# Patient Record
Sex: Male | Born: 1940 | Race: White | Hispanic: No | Smoking: Former smoker
Health system: Southern US, Community
[De-identification: ages and names within clinical notes are randomized; demographics above are authoritative.]

## PROBLEM LIST (undated history)

## (undated) DIAGNOSIS — N189 Chronic kidney disease, unspecified: Secondary | ICD-10-CM

## (undated) DIAGNOSIS — D649 Anemia, unspecified: Secondary | ICD-10-CM

## (undated) DIAGNOSIS — M1990 Unspecified osteoarthritis, unspecified site: Secondary | ICD-10-CM

## (undated) DIAGNOSIS — I10 Essential (primary) hypertension: Secondary | ICD-10-CM

## (undated) DIAGNOSIS — I219 Acute myocardial infarction, unspecified: Secondary | ICD-10-CM

## (undated) DIAGNOSIS — I509 Heart failure, unspecified: Secondary | ICD-10-CM

## (undated) DIAGNOSIS — R0602 Shortness of breath: Secondary | ICD-10-CM

## (undated) DIAGNOSIS — Z950 Presence of cardiac pacemaker: Secondary | ICD-10-CM

## (undated) DIAGNOSIS — E119 Type 2 diabetes mellitus without complications: Secondary | ICD-10-CM

## (undated) DIAGNOSIS — I4891 Unspecified atrial fibrillation: Secondary | ICD-10-CM

## (undated) DIAGNOSIS — J449 Chronic obstructive pulmonary disease, unspecified: Secondary | ICD-10-CM

## (undated) DIAGNOSIS — Z95810 Presence of automatic (implantable) cardiac defibrillator: Secondary | ICD-10-CM

## (undated) DIAGNOSIS — E039 Hypothyroidism, unspecified: Secondary | ICD-10-CM

## (undated) DIAGNOSIS — K219 Gastro-esophageal reflux disease without esophagitis: Secondary | ICD-10-CM

## (undated) DIAGNOSIS — I2510 Atherosclerotic heart disease of native coronary artery without angina pectoris: Secondary | ICD-10-CM

## (undated) DIAGNOSIS — J189 Pneumonia, unspecified organism: Secondary | ICD-10-CM

## (undated) HISTORY — PX: CORONARY ANGIOPLASTY: SHX604

## (undated) HISTORY — PX: APPENDECTOMY: SHX54

## (undated) HISTORY — PX: INSERT / REPLACE / REMOVE PACEMAKER: SUR710

---

## 2014-01-17 ENCOUNTER — Institutional Professional Consult (permissible substitution): Payer: Self-pay | Admitting: Internal Medicine

## 2014-01-29 ENCOUNTER — Inpatient Hospital Stay (HOSPITAL_COMMUNITY)
Admission: AD | Admit: 2014-01-29 | Discharge: 2014-02-11 | DRG: 264 | Disposition: A | Discharge status: Home / Self Care | Payer: Medicare PPO | Source: Other Acute Inpatient Hospital | Attending: Cardiology | Admitting: Cardiology

## 2014-01-29 ENCOUNTER — Encounter (HOSPITAL_COMMUNITY): Payer: Self-pay | Admitting: General Practice

## 2014-01-29 DIAGNOSIS — Z7901 Long term (current) use of anticoagulants: Secondary | ICD-10-CM

## 2014-01-29 DIAGNOSIS — I252 Old myocardial infarction: Secondary | ICD-10-CM

## 2014-01-29 DIAGNOSIS — Z91199 Patient's noncompliance with other medical treatment and regimen due to unspecified reason: Secondary | ICD-10-CM

## 2014-01-29 DIAGNOSIS — K219 Gastro-esophageal reflux disease without esophagitis: Secondary | ICD-10-CM | POA: Diagnosis present

## 2014-01-29 DIAGNOSIS — Z95810 Presence of automatic (implantable) cardiac defibrillator: Secondary | ICD-10-CM | POA: Diagnosis present

## 2014-01-29 DIAGNOSIS — IMO0001 Reserved for inherently not codable concepts without codable children: Secondary | ICD-10-CM | POA: Diagnosis present

## 2014-01-29 DIAGNOSIS — I428 Other cardiomyopathies: Secondary | ICD-10-CM | POA: Diagnosis present

## 2014-01-29 DIAGNOSIS — Z87891 Personal history of nicotine dependence: Secondary | ICD-10-CM

## 2014-01-29 DIAGNOSIS — I2510 Atherosclerotic heart disease of native coronary artery without angina pectoris: Secondary | ICD-10-CM | POA: Diagnosis present

## 2014-01-29 DIAGNOSIS — R0602 Shortness of breath: Secondary | ICD-10-CM

## 2014-01-29 DIAGNOSIS — E871 Hypo-osmolality and hyponatremia: Secondary | ICD-10-CM | POA: Diagnosis present

## 2014-01-29 DIAGNOSIS — I25810 Atherosclerosis of coronary artery bypass graft(s) without angina pectoris: Secondary | ICD-10-CM

## 2014-01-29 DIAGNOSIS — Z9861 Coronary angioplasty status: Secondary | ICD-10-CM

## 2014-01-29 DIAGNOSIS — N179 Acute kidney failure, unspecified: Secondary | ICD-10-CM

## 2014-01-29 DIAGNOSIS — N058 Unspecified nephritic syndrome with other morphologic changes: Secondary | ICD-10-CM | POA: Diagnosis present

## 2014-01-29 DIAGNOSIS — I5023 Acute on chronic systolic (congestive) heart failure: Secondary | ICD-10-CM

## 2014-01-29 DIAGNOSIS — E1129 Type 2 diabetes mellitus with other diabetic kidney complication: Secondary | ICD-10-CM | POA: Diagnosis present

## 2014-01-29 DIAGNOSIS — N189 Chronic kidney disease, unspecified: Secondary | ICD-10-CM

## 2014-01-29 DIAGNOSIS — E875 Hyperkalemia: Secondary | ICD-10-CM | POA: Diagnosis present

## 2014-01-29 DIAGNOSIS — I509 Heart failure, unspecified: Secondary | ICD-10-CM

## 2014-01-29 DIAGNOSIS — N186 End stage renal disease: Secondary | ICD-10-CM | POA: Diagnosis present

## 2014-01-29 DIAGNOSIS — I5043 Acute on chronic combined systolic (congestive) and diastolic (congestive) heart failure: Principal | ICD-10-CM | POA: Diagnosis present

## 2014-01-29 DIAGNOSIS — E039 Hypothyroidism, unspecified: Secondary | ICD-10-CM | POA: Diagnosis present

## 2014-01-29 DIAGNOSIS — Z8249 Family history of ischemic heart disease and other diseases of the circulatory system: Secondary | ICD-10-CM

## 2014-01-29 DIAGNOSIS — D638 Anemia in other chronic diseases classified elsewhere: Secondary | ICD-10-CM | POA: Diagnosis present

## 2014-01-29 DIAGNOSIS — I10 Essential (primary) hypertension: Secondary | ICD-10-CM | POA: Diagnosis present

## 2014-01-29 DIAGNOSIS — J9601 Acute respiratory failure with hypoxia: Secondary | ICD-10-CM | POA: Diagnosis present

## 2014-01-29 DIAGNOSIS — I4891 Unspecified atrial fibrillation: Secondary | ICD-10-CM

## 2014-01-29 DIAGNOSIS — J9600 Acute respiratory failure, unspecified whether with hypoxia or hypercapnia: Secondary | ICD-10-CM | POA: Diagnosis present

## 2014-01-29 DIAGNOSIS — Z9119 Patient's noncompliance with other medical treatment and regimen: Secondary | ICD-10-CM

## 2014-01-29 DIAGNOSIS — M129 Arthropathy, unspecified: Secondary | ICD-10-CM | POA: Diagnosis present

## 2014-01-29 HISTORY — DX: Chronic kidney disease, unspecified: N18.9

## 2014-01-29 HISTORY — DX: Essential (primary) hypertension: I10

## 2014-01-29 HISTORY — DX: Unspecified atrial fibrillation: I48.91

## 2014-01-29 HISTORY — DX: Anemia, unspecified: D64.9

## 2014-01-29 HISTORY — DX: Atherosclerotic heart disease of native coronary artery without angina pectoris: I25.10

## 2014-01-29 HISTORY — DX: Gastro-esophageal reflux disease without esophagitis: K21.9

## 2014-01-29 HISTORY — DX: Shortness of breath: R06.02

## 2014-01-29 HISTORY — DX: Pneumonia, unspecified organism: J18.9

## 2014-01-29 HISTORY — DX: Presence of automatic (implantable) cardiac defibrillator: Z95.810

## 2014-01-29 HISTORY — DX: Heart failure, unspecified: I50.9

## 2014-01-29 HISTORY — DX: Type 2 diabetes mellitus without complications: E11.9

## 2014-01-29 HISTORY — DX: Hypothyroidism, unspecified: E03.9

## 2014-01-29 HISTORY — DX: Unspecified osteoarthritis, unspecified site: M19.90

## 2014-01-29 HISTORY — DX: Acute myocardial infarction, unspecified: I21.9

## 2014-01-29 HISTORY — DX: Chronic obstructive pulmonary disease, unspecified: J44.9

## 2014-01-29 HISTORY — DX: Presence of cardiac pacemaker: Z95.0

## 2014-01-29 LAB — CBC
HCT: 31.1 % — ABNORMAL LOW (ref 39.0–52.0)
Hemoglobin: 10.3 g/dL — ABNORMAL LOW (ref 13.0–17.0)
MCH: 29.5 pg (ref 26.0–34.0)
MCHC: 33.1 g/dL (ref 30.0–36.0)
MCV: 89.1 fL (ref 78.0–100.0)
PLATELETS: 212 10*3/uL (ref 150–400)
RBC: 3.49 MIL/uL — ABNORMAL LOW (ref 4.22–5.81)
RDW: 15.1 % (ref 11.5–15.5)
WBC: 6.9 10*3/uL (ref 4.0–10.5)

## 2014-01-29 LAB — COMPREHENSIVE METABOLIC PANEL
ALT: 21 U/L (ref 0–53)
AST: 24 U/L (ref 0–37)
Albumin: 3.3 g/dL — ABNORMAL LOW (ref 3.5–5.2)
Alkaline Phosphatase: 63 U/L (ref 39–117)
BUN: 97 mg/dL — AB (ref 6–23)
CO2: 23 mEq/L (ref 19–32)
CREATININE: 3.53 mg/dL — AB (ref 0.50–1.35)
Calcium: 8.4 mg/dL (ref 8.4–10.5)
Chloride: 89 mEq/L — ABNORMAL LOW (ref 96–112)
GFR calc Af Amer: 18 mL/min — ABNORMAL LOW (ref >90)
GFR calc non Af Amer: 16 mL/min — ABNORMAL LOW (ref >90)
Glucose, Bld: 207 mg/dL — ABNORMAL HIGH (ref 70–99)
Potassium: 5.1 mEq/L (ref 3.7–5.3)
Sodium: 132 mEq/L — ABNORMAL LOW (ref 137–147)
TOTAL PROTEIN: 6.6 g/dL (ref 6.0–8.3)
Total Bilirubin: <0.2 mg/dL — ABNORMAL LOW (ref 0.3–1.2)

## 2014-01-29 LAB — TSH: TSH: 3.42 u[IU]/mL (ref 0.350–4.500)

## 2014-01-29 LAB — PHOSPHORUS: PHOSPHORUS: 5.5 mg/dL — AB (ref 2.3–4.6)

## 2014-01-29 LAB — MAGNESIUM: MAGNESIUM: 2 mg/dL (ref 1.5–2.5)

## 2014-01-29 LAB — DIGOXIN LEVEL: DIGOXIN LVL: 0.3 ng/mL — AB (ref 0.8–2.0)

## 2014-01-29 LAB — GLUCOSE, CAPILLARY
GLUCOSE-CAPILLARY: 210 mg/dL — AB (ref 70–99)
Glucose-Capillary: 272 mg/dL — ABNORMAL HIGH (ref 70–99)
Glucose-Capillary: 147 mg/dL — ABNORMAL HIGH (ref 70–99)

## 2014-01-29 LAB — TROPONIN I

## 2014-01-29 LAB — PRO B NATRIURETIC PEPTIDE: Pro B Natriuretic peptide (BNP): 24228 pg/mL — ABNORMAL HIGH (ref 0–125)

## 2014-01-29 MED ORDER — HYDRALAZINE HCL 25 MG PO TABS
12.5000 mg | ORAL_TABLET | Freq: Three times a day (TID) | ORAL | Status: DC
  Filled 2014-01-29 (×2): qty 0.5

## 2014-01-29 MED ORDER — ONDANSETRON HCL 4 MG/2ML IJ SOLN: 4.0000 mg | Freq: Four times a day (QID) | INTRAMUSCULAR | Status: DC | PRN

## 2014-01-29 MED ORDER — HYDRALAZINE HCL 25 MG PO TABS
12.5000 mg | ORAL_TABLET | Freq: Three times a day (TID) | ORAL | Status: DC
  Administered 2014-01-29 – 2014-02-01 (×8): 12.5 mg via ORAL
  Filled 2014-01-29 (×13): qty 0.5

## 2014-01-29 MED ORDER — ALBUTEROL SULFATE (2.5 MG/3ML) 0.083% IN NEBU
2.5000 mg | INHALATION_SOLUTION | RESPIRATORY_TRACT | Status: DC | PRN
  Administered 2014-02-03 – 2014-02-07 (×2): 2.5 mg via RESPIRATORY_TRACT
  Filled 2014-01-29 (×2): qty 3

## 2014-01-29 MED ORDER — HYDROCODONE-ACETAMINOPHEN 5-325 MG PO TABS
1.0000 | ORAL_TABLET | ORAL | Status: DC | PRN
  Administered 2014-02-03 – 2014-02-06 (×5): 1 via ORAL
  Filled 2014-01-29 (×5): qty 1

## 2014-01-29 MED ORDER — ONDANSETRON HCL 4 MG PO TABS: 4.0000 mg | ORAL_TABLET | Freq: Four times a day (QID) | ORAL | Status: DC | PRN

## 2014-01-29 MED ORDER — MILRINONE IN DEXTROSE 20 MG/100ML IV SOLN
0.2500 ug/kg/min | INTRAVENOUS | Status: DC
  Administered 2014-01-29 – 2014-01-31 (×5): 0.25 ug/kg/min via INTRAVENOUS
  Filled 2014-01-29 (×6): qty 100

## 2014-01-29 MED ORDER — ENOXAPARIN SODIUM 100 MG/ML ~~LOC~~ SOLN
100.0000 mg | Freq: Every day | SUBCUTANEOUS | Status: DC
  Administered 2014-01-30: 100 mg via SUBCUTANEOUS
  Filled 2014-01-29 (×3): qty 1

## 2014-01-29 MED ORDER — ENOXAPARIN SODIUM 40 MG/0.4ML ~~LOC~~ SOLN
40.0000 mg | SUBCUTANEOUS | Status: DC
  Administered 2014-01-29: 40 mg via SUBCUTANEOUS
  Filled 2014-01-29: qty 0.4

## 2014-01-29 MED ORDER — HYDROMORPHONE HCL PF 1 MG/ML IJ SOLN
0.5000 mg | INTRAMUSCULAR | Status: DC | PRN
  Administered 2014-01-31: 0.5 mg via INTRAVENOUS
  Filled 2014-01-29: qty 1

## 2014-01-29 MED ORDER — SODIUM CHLORIDE 0.9 % IJ SOLN
3.0000 mL | Freq: Two times a day (BID) | INTRAMUSCULAR | Status: DC
  Administered 2014-01-30: 3 mL via INTRAVENOUS

## 2014-01-29 MED ORDER — FUROSEMIDE 10 MG/ML IJ SOLN
20.0000 mg/h | INTRAVENOUS | Status: DC
  Administered 2014-01-29 – 2014-01-30 (×2): 15 mg/h via INTRAVENOUS
  Administered 2014-01-31: 20 mg/h via INTRAVENOUS
  Administered 2014-01-31: 15 mg/h via INTRAVENOUS
  Filled 2014-01-29 (×9): qty 25

## 2014-01-29 MED ORDER — ISOSORBIDE DINITRATE 10 MG PO TABS
10.0000 mg | ORAL_TABLET | Freq: Three times a day (TID) | ORAL | Status: DC
  Administered 2014-01-29 – 2014-01-30 (×4): 10 mg via ORAL
  Filled 2014-01-29 (×5): qty 1

## 2014-01-29 MED ORDER — SODIUM CHLORIDE 0.9 % IV SOLN: 250.0000 mL | INTRAVENOUS | Status: DC | PRN

## 2014-01-29 MED ORDER — ISOSORBIDE DINITRATE 10 MG PO TABS
10.0000 mg | ORAL_TABLET | Freq: Three times a day (TID) | ORAL | Status: DC
  Filled 2014-01-29 (×2): qty 1

## 2014-01-29 MED ORDER — SODIUM CHLORIDE 0.9 % IJ SOLN: 3.0000 mL | INTRAMUSCULAR | Status: DC | PRN

## 2014-01-29 NOTE — Consult Note (Signed)
Reason for Consult: CHF   Referring Physician:  Dr. Izola [name] ZOX:WRUEAV [name] [name] Primary Cardiologist: Dr. Dulce [name] EP: Dr. Philis [name] ?   [name] [name] is an 73 y.o. male.    Chief Complaint: SOB   HPI: 73 yo male with COPD, DM, HTN, HLD, CAD with BiV ICD, Anemia of chronic disease, Hypothyroidism, CHF (last EF 10% on last 2 D ECHO in January 2015), hospitalized in [place] from April 24,th 2015 - April 28th, 2015 and transferred to [facility] for further evaluation and management of CHF and failure to respond to medical therapy with Lasix IV. Pt explains he started to feel sick 2 weeks prior to admission to [facility] with main concern of progressively worsening shortness of breath, weight gain of 15 lbs (211 lbs at baseline --> 228 lbs on admission). He explains he has been non compliant with dietary restrictions- he was told his sodium was low so he ate more salt-, has reported 3-4 pillow orthopnea, dyspnea at rest and with exertion, no specific alleviating or aggravating factors. In addition, he reported productive cough of yellow sputum but this has now gotten better. Denies any chest pain.   While at [facility], he has been treated with Zithromax and Rocephin with BD's for COPD, he was also place on Lasix drip, metolazone by Dr. Dulce [name] (pt also started on Hydralazine, nitrates, low dose digoxin, fluid restriction) but has failed to respond to medical therapy and was therefore transferred to [facility].   He has seen Dr. Lowell [name] in the past for renal failure and plan was to place AV fistula soon.  Currently he does not have fistula.  Cardiac hx includes MI  > 15 yrs ago with low EF and PCI to LAD.  Since that time he has been followed for cardiomyopathy.  He has recurrent decompensation due to non compliance or anemia or COPD exacerbation.  He cannot use BB due to COPD and bronchospasm and no ACE or ARB due to chronic renal failure.  He has BiV ICD with most recent battery change 2 years ago.   He has atrial fib on eliquis.    Currently, SOB with talking.  HOB elevated for comfort. BMP today Na 130, K+ 6.1; cl 91; TCO2 23; glucose 152; BUN 95; Cr 3.50; mag 1.8;ca+ 8.4; a gap 22 GFR 17  Pro BNP 30,300; troponin neg;   H/H 10.5/30.8 WBC 7.0  plt 201 Heme + stool on the 27th   Past Medical History  Diagnosis Date  . Coronary artery disease   . Myocardial infarction   . Hypertension   . CHF (congestive heart failure)   . Shortness of breath   . Pacemaker   . Automatic implantable cardioverter-defibrillator in situ   . COPD (chronic obstructive pulmonary disease)   . Pneumonia     HX OF PNA  . Hypothyroidism   . 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 Chronic kidney disease   . GERD (gastroesophageal reflux disease)   . Arthritis   . Anemia     Past Surgical History  Procedure Laterality Date  . Appendectomy    . Insert / replace / remove pacemaker    . Coronary angioplasty      History reviewed. No pertinent family history. Social History:  reports that he quit smoking about 20 years ago. His smoking use included Cigarettes. He smoked 0.00 packs per day for 35 years. He has never used smokeless tobacco. He reports  that he does not drink alcohol or use illicit drugs. His wife was pt of Dr. Alanda [name].  She died in 74.  Allergies: No Known Allergies  OUTPATIENT MEDICATIONS: See med rec    No results found for this or any previous visit (from the past 48 hour(s)). No results found.  ROS: General:no colds or fevers, no weight changes Skin:no rashes or ulcers HEENT:no blurred vision, no congestion CV:see HPI PUL:see HPI GI:no diarrhea constipation or melena, no indigestion GU:no hematuria, no dysuria MS:no joint pain, no claudication Neuro:no syncope, no lightheadedness Endo:+ diabetes, no thyroid disease   Blood pressure 129/71, pulse 71, temperature 97.6 F (36.4 C), temperature source Oral, resp. rate 18, SpO2 99.00%. General: NAD Neck:  Thick, JVP 12 cm, no thyromegaly or thyroid nodule.  Lungs: Crackles at bases bilaterally CV: Nondisplaced PMI.  Heart regular S1/S2, no S3/S4, no murmur.  2+ edema to thighs bilaterally.  No carotid bruit.  Unable to palpate pedal pulses but has significant LE edema.  Abdomen: Soft, nontender, no hepatosplenomegaly, no distention.  Skin: Intact without lesions or rashes.  Neurologic: Alert and oriented x 3.  Psych: Normal affect. Extremities: No clubbing or cyanosis.  HEENT: Normal.   Assessment/Plan Principal Problem:   Acute respiratory failure with hypoxia Active Problems:   Shortness of breath   CHF (congestive heart failure), NYHA class IV   Acute on chronic renal failure   Hyperkalemia   Anemia of chronic disease   HTN (hypertension)   Unspecified hypothyroidism   Hyponatremia   CAD (coronary artery disease) of artery bypass graft   [name] [name]  Nurse Practitioner Certified [facility] Pager [phone] or after 5pm or weekends call [phone] 01/29/2014, 3:19 PM  Patient seen with NP, agree with the above note.  1. CHF: Acute on chronic CHF, EF 10% per report (done in [place]).  He is massively volume overloaded on exam. He has been at [facility] where they have had him on Lasix gtt @ 10 mg/hr.  He has not diuresed much at all on this regimen and weight has actually increased.  Creatinine was 3.5 today with K 6.1.  Suspect cardiorenal syndrome.   Unfortunately, he is nearing the need for hemodialysis for volume removal.  - I will give him a trial of milrinone to augment cardiac output to see is this will help diuresis.  Will start low dose 0.25 mcg/kg/min.  After milrinone has been running for about 30 minutes, restart Lasix gtt at 15 mg/hr.    - Stop digoxin.  - Will continue hydralazine at 12.5 mg every 8 hrs and isordil 10 mg every 8 hrs.  - No ACEI or spironolactone with CKD, hold off on beta blocker for now with concern for low output.  -  Will need to figure out what type of CRT-D device patient has so it can be interrogated.  2. Atrial fibrillation: Patient appears to be in underlying atrial fibrillation with BiV pacing.  He does not know what type of device he has.  Rate is 70.  I will get an ECG and interrogate his device to confirm underlying atrial fibrillation. Will need to watch for rise in rate with milrinone.  He has been on Eliquis.  Will hold for now for possible procedure (will need access if he ends up needing HD).  3. AKI on CKD: See above discussion.  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  Creatinine up to 3.5 with K 6.1.  Unclear if K  was treated today.  - Send BMET stat. - As above, plan trial of inotrope to see if augmentation of cardiac output can improve urine output.   If this fails, he will likely need hemodialysis for intractable volume overload.  4. CAD: Continue statin.  He is anticoagulated for atrial fibrillation.   No chest pain.   Laurey MoraleDalton S Navina Wohlers 01/29/2014 4:47 PM

## 2014-01-29 NOTE — Progress Notes (Signed)
Late Entry  Patient arrived to 69E01 from [facility] at approx 1130. Pt admitted with a lasix drip infusing continuously. Verified with [facility]) and unit pharmacist that it was appropriate to keep the patient on the unit. Later in the evening MD requested pt be started on a milrinone drip - pt requiring transfer to SDU at this time. Will monitor.  Cheril Slattery Arvella Nigh

## 2014-01-29 NOTE — Progress Notes (Addendum)
ANTICOAGULATION CONSULT NOTE - Initial Consult  Pharmacy Consult for Lovenox Indication: atrial fibrillation  No Known Allergies  Patient Measurements: Height: 5\' 9"  (175.3 cm) Weight: 226 lb 9.6 oz (102.785 kg) IBW/kg (Calculated) : 70.7 Heparin Dosing Weight:    Vital Signs: Temp: 98.1 F (36.7 C) (04/28 1949) Temp src: Oral (04/28 1949) BP: 124/66 mmHg (04/28 1949) Pulse Rate: 70 (04/28 1820)  Labs:  Recent Labs  01/29/14 1910  HGB 10.3*  HCT 31.1*  PLT 212  CREATININE 3.53*  TROPONINI <0.30    Estimated Creatinine Clearance: 21.7 ml/min (by C-G formula based on Cr of 3.53).   Medical History: Past Medical History  Diagnosis Date  . Coronary artery disease   . Myocardial infarction   . Hypertension   . CHF (congestive heart failure)   . Shortness of breath   . Pacemaker   . Automatic implantable cardioverter-defibrillator in situ     st jude BiV ICD  . COPD (chronic obstructive pulmonary disease)   . Pneumonia     HX OF PNA  . Hypothyroidism   . 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 Chronic kidney disease   . GERD (gastroesophageal reflux disease)   . Arthritis   . Anemia   . Atrial fibrillation     rate controlled    Medications:  No prescriptions prior to admission    Assessment: SOB 73 y/o M with complex PMH was transferred from [place] to [facility] for management of CHF after failing to respond to Lasix drip, metolazone, hydralazine, nitrates, digoxin. Last EF 1/15=10%. Patient has a h/o noncompliance and CKD (no ACE/ARB). Pt also in afib. Heme + stool noted (at [place]) on 4/27.  Labs:  Scr 3.53 with calculated CrCl 21 proBNP 24,228, Hgb 10.3  Angicoagulation: Pharmacy consulted to dose Lovenox for afib, however noted pt took Eliquis 5mg  BID PTA. I do not see it on his [facility] and pt doesn't know if he was receiving it or not there. Therefore, last dose is unknown whether is was prior to his [place] admission or this  AM.   Goal of Therapy:  Anti-Xa level 0.6-1 units/ml 4hrs after LMWH dose given Monitor platelets by anticoagulation protocol: Yes   Plan:  F/u need for HD Start Lovenox 100mg  SQ ONCE daily for renal function. CBC q72h while on Lovenox.   Keaun Schnabel S. Merilynn Finland, PharmD, [facility] Clinical Staff Pharmacist Pager [phone]  Pasty Spillers 01/29/2014,10:28 PM

## 2014-01-29 NOTE — Progress Notes (Signed)
Rapid response RN notified of pt's need to transfer to SDU. Pt is currently stable. Report called to [facility] by Morrie Sheldon, RN. Will monitor.  [name] [name] [name] [name]

## 2014-01-29 NOTE — Progress Notes (Signed)
Patient transferred to [facility].  Report called to RN.  CCMD notified of patient transfer.  Patient family members at bedside and escorted to new patient room.  Transferred with RN and NT.

## 2014-01-29 NOTE — H&P (Addendum)
[name] [name] History and Physical  [name] ReelBernie [name] ZOX:[id number] DOB: 12/11/1940 DOA: 01/29/2014  Referring [name]: [name] [name] PCP: [name] [name]   Chief Complaint: shortness of breath   HPI:  Pt is 73 yo male with COPD, DM, HTN, HLD, CAD with pacemaker, Anemia of chronic disease, Hypothyroidism, CHF (last EF 10% on last 2 D ECHO in January 2015), hospitalized in [place] from April 24,th 2015 - April 28th, 2015 and transferred to [name] [name] for further evaluation and management of CHF and failure to respond to medical therapy with Lasix. Pt explains he started to feel sick 2 weeks prior to admission to [name] with main concern of progressively worsening shortness of breath, weight gain of 15 lbs (211 lbs at baseline --> 228 lbs on admission). He explains he has been non compliant with dietary restrictions, has reported 3-4 pillow orthopnea, dyspnea at rest and with exertion, no specific alleviating or aggravating factors. In addition, he reported productive cough of yellow sputum but this has now gotten better.   [facility], he has been treated with Zithromax and Rocephin with BD's for COPD, he was also place on Lasix drip, metolazone by Dr. Dulce [name] (pt also started on Hydralazine, nitrates, low dose digoxin, fluid restriction) but has failed to respond to medical therapy and was therefore transferred to [name] [name].   [name] [name] with nephrology team has also been contacted and recommendation was to transfer to [name] [name] for further management.   Please note following vitals on discharge from [name] [name]: T 97.7 F, HR 70, RR 20, )2 sat 96&, Weight 226 lbs Blood work: Na 130, K 6.1, Cl 91, HCO3  23, BUN 95, Cr 3.50 WBC 7, Hg, 10.5, Plt 201  Assessment and Plan: Principal Problem:   Acute respiratory failure with hypoxia - secondary to severe systolic and diastolic CHF - currently on Lasix drip which will continue here and will also consult cardiology for further  recommendations - will also consult nephrology as pt is severely volume overloaded and is possibly going to require HD for better volume control  Active Problems:   CHF (congestive heart failure), NYHA class IV - management with Lasix drip for now, rest per cardiology team - continue hydralazine as well   Acute on chronic renal failure - will repeat BMP now due to hyperkalemia earlier in the day - lasix drip as noted above, nephrology consultation requested    Hyperkalemia - repeat BMP and monitor on telemetry    Anemia of chronic disease - no signs of active bleeding - CBC in AM   HTN (hypertension) - continue home medical regimen    Hyponatremia - secondary to volume status - repeat BMP in AM   CAD (coronary artery disease) of artery bypass graft - continue statin    Unspecified hypothyroidism - continue synthroid   Radiological Exams on Admission: No results found.  Code Status: Full Family Communication: Pt at bedside Disposition Plan: Admit for further evaluation    Review of Systems:  Constitutional: Negative for diaphoresis.  HENT: Negative for hearing loss, ear pain, nosebleeds, congestion, sore throat, neck pain, tinnitus and ear discharge.   Eyes: Negative for blurred vision, double vision, photophobia, pain, discharge and redness.  Respiratory: Per HPI.   Cardiovascular: per HPI Gastrointestinal: Negative for heartburn, constipation, blood in stool and melena.  Genitourinary: Negative for dysuria, urgency, frequency, hematuria and flank pain.  Musculoskeletal: Negative for myalgias, back pain, joint pain and falls.  Skin: Negative for itching and rash.  Neurological: Negative for  tingling, tremors, sensory change, speech change, focal weakness, loss of consciousness and headaches.  Endo/Heme/Allergies: Negative for environmental allergies and polydipsia. Does not bruise/bleed easily.  Psychiatric/Behavioral: Negative for suicidal ideas. The patient is not  nervous/anxious.      Past Medical History  Diagnosis Date  . Coronary artery disease   . Myocardial infarction   . Hypertension   . CHF (congestive heart failure)   . Shortness of breath   . Pacemaker   . Automatic implantable cardioverter-defibrillator in situ   . COPD (chronic obstructive pulmonary disease)   . Pneumonia     HX OF PNA  . Hypothyroidism   . 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 Chronic kidney disease   . GERD (gastroesophageal reflux disease)   . Arthritis   . Anemia     Past Surgical History  Procedure Laterality Date  . Appendectomy    . Insert / replace / remove pacemaker    . Coronary angioplasty      Social History:  reports that he quit smoking about 20 years ago. His smoking use included Cigarettes. He smoked 0.00 packs per day for 35 years. He has never used smokeless tobacco. He reports that he does not drink alcohol or use illicit drugs.  Not on File  Family history of HTN  Prior to Admission medications   Not on File    Physical Exam: Filed Vitals:   01/29/14 1149  BP: 129/71  Pulse: 71  Temp: 97.6 F (36.4 C)  TempSrc: Oral  Resp: 18  SpO2: 99%    Physical Exam  Constitutional: Appears in mild distress due to shortness of breath, diffuse anasarca  HENT: Normocephalic. External right and left ear normal. Oropharynx is clear and moist.  Eyes: Conjunctivae and EOM are normal. PERRLA, no scleral icterus.  Neck: Normal ROM. Neck supple. + JVD. No tracheal deviation. No thyromegaly.  CVS: RRR, S1/S2 +, no murmurs, no gallops, no carotid bruit.  Pulmonary: Rales bilaterally with dyspnea Abdominal: Soft. BS +,  no distension, tenderness, rebound or guarding.  Musculoskeletal: Normal range of motion. +4 bilateral LE pitting edema Lymphadenopathy: No lymphadenopathy noted, cervical, inguinal. Neuro: Alert. Normal reflexes, muscle tone coordination. No cranial nerve deficit. Skin: Skin is warm and dry. No rash noted.  Not diaphoretic. No erythema. No pallor.  Psychiatric: Normal mood and affect. Behavior, judgment, thought content normal.   Labs on Admission:  Basic Metabolic Panel: No results found for this basename: NA, K, CL, CO2, GLUCOSE, BUN, CREATININE, CALCIUM, MG, PHOS,  in the last 168 hours Liver Function Tests: No results found for this basename: AST, ALT, ALKPHOS, BILITOT, PROT, ALBUMIN,  in the last 168 hours No results found for this basename: LIPASE, AMYLASE,  in the last 168 hours No results found for this basename: AMMONIA,  in the last 168 hours CBC: No results found for this basename: WBC, NEUTROABS, HGB, HCT, MCV, PLT,  in the last 168 hours Cardiac Enzymes: No results found for this basename: CKTOTAL, CKMB, CKMBINDEX, TROPONINI,  in the last 168 hours BNP: No components found with this basename: POCBNP,  CBG: No results found for this basename: GLUCAP,  in the last 168 hours  EKG: Normal sinus rhythm, no ST/T wave changes  Dorothea Ogle, MD  [name] [name] Pager [phone]  If 7PM-7AM, please contact night-coverage [email or web address] Password [facility] 01/29/2014, 12:41 PM

## 2014-01-29 NOTE — Progress Notes (Signed)
St Jude BiV ICD interrogated -pt has been in atrial fib since 02/2013 at least.

## 2014-01-29 NOTE — Progress Notes (Addendum)
Admission note:  Verified patient ID with name and birthday.  Wristband placed on patient   Arrival Method: stretcher via [facility] Mental Orientation: A&O x4 Telemetry: placed on telemetry box #2, CCMD notified, Patient running v-paced  Assessment: See Doc Flowsheets Skin: blancheable redness noted on sacrum.  Noted in Doc Flowsheets IV: Right wrist IV access with Lasix drip running continuously Pain: denies  Tubes: n/a Safety Measures: Patient educated on fall safety.  Yellow non-slip socks on patient.  Bed alarm on.  Patient Educated on use of call bell and telephone and demonstrated understanding.  Family at bedside.   6700 Orientation: Patient has been oriented to the unit, staff and to the room.

## 2014-01-30 DIAGNOSIS — J9600 Acute respiratory failure, unspecified whether with hypoxia or hypercapnia: Secondary | ICD-10-CM

## 2014-01-30 DIAGNOSIS — I509 Heart failure, unspecified: Secondary | ICD-10-CM

## 2014-01-30 LAB — BASIC METABOLIC PANEL
BUN: 96 mg/dL — ABNORMAL HIGH (ref 6–23)
CALCIUM: 8.4 mg/dL (ref 8.4–10.5)
CO2: 24 mEq/L (ref 19–32)
CREATININE: 3.64 mg/dL — AB (ref 0.50–1.35)
Chloride: 95 mEq/L — ABNORMAL LOW (ref 96–112)
GFR calc Af Amer: 18 mL/min — ABNORMAL LOW (ref >90)
GFR, EST NON AFRICAN AMERICAN: 15 mL/min — AB (ref >90)
GLUCOSE: 109 mg/dL — AB (ref 70–99)
Potassium: 4.5 mEq/L (ref 3.7–5.3)
SODIUM: 138 meq/L (ref 137–147)

## 2014-01-30 LAB — GLUCOSE, CAPILLARY
Glucose-Capillary: 214 mg/dL — ABNORMAL HIGH (ref 70–99)
Glucose-Capillary: 185 mg/dL — ABNORMAL HIGH (ref 70–99)
Glucose-Capillary: 124 mg/dL — ABNORMAL HIGH (ref 70–99)

## 2014-01-30 LAB — URINALYSIS, ROUTINE W REFLEX MICROSCOPIC
Bilirubin Urine: NEGATIVE
Bilirubin Urine: NEGATIVE
GLUCOSE, UA: NEGATIVE mg/dL
GLUCOSE, UA: 250 mg/dL — AB
Hgb urine dipstick: NEGATIVE
KETONES UR: NEGATIVE mg/dL
KETONES UR: NEGATIVE mg/dL
LEUKOCYTES UA: NEGATIVE
Leukocytes, UA: NEGATIVE
NITRITE: NEGATIVE
Nitrite: NEGATIVE
PROTEIN: NEGATIVE mg/dL
Protein, ur: NEGATIVE mg/dL
SPECIFIC GRAVITY, URINE: 1.016 (ref 1.005–1.030)
Specific Gravity, Urine: 1.027 (ref 1.005–1.030)
Urobilinogen, UA: 0.2 mg/dL (ref 0.0–1.0)
Urobilinogen, UA: 0.2 mg/dL (ref 0.0–1.0)
pH: 5 (ref 5.0–8.0)
pH: 6 (ref 5.0–8.0)

## 2014-01-30 LAB — CBC
HCT: 29.2 % — ABNORMAL LOW (ref 39.0–52.0)
Hemoglobin: 9.6 g/dL — ABNORMAL LOW (ref 13.0–17.0)
MCH: 29.4 pg (ref 26.0–34.0)
MCHC: 32.9 g/dL (ref 30.0–36.0)
MCV: 89.3 fL (ref 78.0–100.0)
Platelets: 205 10*3/uL (ref 150–400)
RBC: 3.27 MIL/uL — AB (ref 4.22–5.81)
RDW: 15 % (ref 11.5–15.5)
WBC: 8.2 10*3/uL (ref 4.0–10.5)

## 2014-01-30 LAB — URINE MICROSCOPIC-ADD ON

## 2014-01-30 MED ORDER — ALBUTEROL SULFATE HFA 108 (90 BASE) MCG/ACT IN AERS
2.0000 | INHALATION_SPRAY | RESPIRATORY_TRACT | Status: DC | PRN
  Filled 2014-01-30: qty 6.7

## 2014-01-30 MED ORDER — GEMFIBROZIL 600 MG PO TABS
600.0000 mg | ORAL_TABLET | Freq: Two times a day (BID) | ORAL | Status: DC
  Administered 2014-01-30 – 2014-01-31 (×2): 600 mg via ORAL
  Filled 2014-01-30 (×4): qty 1

## 2014-01-30 MED ORDER — OXYBUTYNIN CHLORIDE ER 10 MG PO TB24
10.0000 mg | ORAL_TABLET | Freq: Every day | ORAL | Status: DC
  Administered 2014-01-30 – 2014-01-31 (×2): 10 mg via ORAL
  Filled 2014-01-30 (×3): qty 1

## 2014-01-30 MED ORDER — ALBUTEROL SULFATE HFA 108 (90 BASE) MCG/ACT IN AERS: 1.0000 | INHALATION_SPRAY | Freq: Four times a day (QID) | RESPIRATORY_TRACT | Status: DC | PRN

## 2014-01-30 MED ORDER — INSULIN ASPART 100 UNIT/ML ~~LOC~~ SOLN
0.0000 [IU] | Freq: Three times a day (TID) | SUBCUTANEOUS | Status: DC
  Administered 2014-01-30: 3 [IU] via SUBCUTANEOUS
  Administered 2014-01-30: 2 [IU] via SUBCUTANEOUS

## 2014-01-30 MED ORDER — FLUTICASONE FUROATE-VILANTEROL 100-25 MCG/INH IN AEPB: 1.0000 "application " | INHALATION_SPRAY | Freq: Every day | RESPIRATORY_TRACT | Status: DC

## 2014-01-30 MED ORDER — METOLAZONE 2.5 MG PO TABS
2.5000 mg | ORAL_TABLET | Freq: Every day | ORAL | Status: DC
  Administered 2014-01-30: 2.5 mg via ORAL
  Filled 2014-01-30 (×2): qty 1

## 2014-01-30 MED ORDER — SODIUM CHLORIDE 0.9 % IV SOLN
INTRAVENOUS | Status: DC
  Administered 2014-02-04: 500 mL via INTRAVENOUS

## 2014-01-30 MED ORDER — INSULIN ASPART 100 UNIT/ML ~~LOC~~ SOLN
0.0000 [IU] | Freq: Three times a day (TID) | SUBCUTANEOUS | Status: DC
  Administered 2014-01-31: 2 [IU] via SUBCUTANEOUS
  Administered 2014-01-31: 1 [IU] via SUBCUTANEOUS
  Administered 2014-02-01: 2 [IU] via SUBCUTANEOUS
  Administered 2014-02-01: 1 [IU] via SUBCUTANEOUS
  Administered 2014-02-01: 2 [IU] via SUBCUTANEOUS
  Administered 2014-02-02 – 2014-02-06 (×8): 1 [IU] via SUBCUTANEOUS
  Administered 2014-02-06 – 2014-02-07 (×2): 2 [IU] via SUBCUTANEOUS
  Administered 2014-02-07: 3 [IU] via SUBCUTANEOUS
  Administered 2014-02-07 – 2014-02-08 (×3): 2 [IU] via SUBCUTANEOUS
  Administered 2014-02-09: 1 [IU] via SUBCUTANEOUS
  Administered 2014-02-09: 2 [IU] via SUBCUTANEOUS

## 2014-01-30 MED ORDER — LEVOTHYROXINE SODIUM 100 MCG PO TABS
100.0000 ug | ORAL_TABLET | Freq: Every day | ORAL | Status: DC
  Administered 2014-01-31 – 2014-02-11 (×12): 100 ug via ORAL
  Filled 2014-01-30 (×14): qty 1

## 2014-01-30 MED ORDER — ATORVASTATIN CALCIUM 80 MG PO TABS
80.0000 mg | ORAL_TABLET | Freq: Every day | ORAL | Status: DC
  Administered 2014-01-30 – 2014-02-10 (×12): 80 mg via ORAL
  Filled 2014-01-30 (×13): qty 1

## 2014-01-30 MED ORDER — ALBUTEROL SULFATE (2.5 MG/3ML) 0.083% IN NEBU
3.0000 mL | INHALATION_SOLUTION | Freq: Four times a day (QID) | RESPIRATORY_TRACT | Status: DC
  Administered 2014-01-30 – 2014-02-07 (×27): 3 mL via RESPIRATORY_TRACT
  Filled 2014-01-30 (×27): qty 3

## 2014-01-30 MED ORDER — ISOSORBIDE DINITRATE 10 MG PO TABS
10.0000 mg | ORAL_TABLET | Freq: Three times a day (TID) | ORAL | Status: DC
  Administered 2014-01-31 – 2014-02-05 (×15): 10 mg via ORAL
  Filled 2014-01-30 (×19): qty 1

## 2014-01-30 MED ORDER — INSULIN GLARGINE 100 UNIT/ML ~~LOC~~ SOLN
10.0000 [IU] | Freq: Every day | SUBCUTANEOUS | Status: DC
  Administered 2014-01-30 – 2014-02-10 (×12): 10 [IU] via SUBCUTANEOUS
  Filled 2014-01-30 (×13): qty 0.1

## 2014-01-30 MED ORDER — DOXAZOSIN MESYLATE 2 MG PO TABS
2.0000 mg | ORAL_TABLET | Freq: Every day | ORAL | Status: DC
  Administered 2014-01-30 – 2014-02-01 (×3): 2 mg via ORAL
  Filled 2014-01-30 (×3): qty 1

## 2014-01-30 NOTE — Progress Notes (Signed)
Inpatient Diabetes Program Recommendations  AACE/ADA: New Consensus Statement on Inpatient Glycemic Control (2013)  Target Ranges:  Prepandial:   less than 140 mg/dL      Peak postprandial:   less than 180 mg/dL (1-2 hours)      Critically ill patients:  140 - 180 mg/dL   Results for DANNER, BUZZA (MRN [id number]) as of 01/30/2014 09:42  Ref. Range 01/29/2014 11:52 01/29/2014 16:37 01/29/2014 22:10  Glucose-Capillary Latest Range: 70-99 mg/dL 664 (H) 403 (H) 474 (H)   Diabetes history: DM2 Outpatient Diabetes medications: Metformin 850 mg TID Current orders for Inpatient glycemic control: None  Inpatient Diabetes Program Recommendations Correction (SSI): Please consider ordering CBGs with Novolog correction scale ACHS. HgbA1C: Please consider ordering an A1C to evaluate glycemic control over the past 2-3 months. Diet: Please change diet from Regular to Carb Modified diet.  Thanks, Orlando Penner, RN, MSN, CCRN Diabetes Coordinator Inpatient Diabetes Program [phone] (Team Pager) [phone] (AP office) [phone] [facility] office)

## 2014-01-30 NOTE — Progress Notes (Signed)
Report called to Verlon Au, RN on [facility].  Updated on patient history, current status, and plan of care.  Patient is stable and able to transfer at this time.

## 2014-01-30 NOTE — Progress Notes (Signed)
Attempted to call report. RN at lunch and will call back. Pt is stable.  Will continue to monitor.

## 2014-01-30 NOTE — Progress Notes (Signed)
Patient seen and examined and agree with assessment and plan per Wilburt Finlay PA-C.  He is still SOB but more comfortable sitting in the chair.  He still has significant orthopnea and PND.  His exam shows crackles about 1/3 way up bilaterally and 3+ edema.  His I&O's are incomplete.  He is on Milrinone but weight has not changed.  Will transfer to 2H stepdown where he can be monitored more closely with I&O's.  Start Metolazone 2.5mg  BID.  Continue Milrinone.  If he does not diurese better will need to proceed with HD which patient is willing to do.

## 2014-01-30 NOTE — Progress Notes (Signed)
[place] TEAM 1 - Stepdown/ICU TEAM Progress Note  Mayra ReelBernie Wyer MVH:[id number] DOB: 01/06/1941 DOA: 01/29/2014 PCP: Lerry Linerwight Williams  Admit HPI / Brief Narrative: 73 yo male with COPD, DM, HTN, HLD, CAD with pacemaker, Anemia of chronic disease, Hypothyroidism, CHF (last EF 10% on last 2 D ECHO in January 2015), hospitalized in [place] from April 24,th 2015 - April 28th, 2015 and transferred [facility] for further evaluation and management of CHF and failure to respond to medical therapy with Lasix. Pt explained he started to feel sick 2 weeks prior to admission to [facility] with main concern of progressively worsening shortness of breath, weight gain of 15 lbs (211 lbs at baseline --> 228 lbs on admission). He explained he had been non compliant with dietary restrictions, was having reported 3-4 pillow orthopnea, and dyspnea at rest and with exertion.    [facility], he was treated with Zithromax and Rocephin with BD's for COPD.  He was also placed on a Lasix drip and metolazone by Dr. Dulce SellarMunley (pt also started on Hydralazine, nitrates, low dose digoxin, fluid restriction) but failed to respond to medical therapy and was therefore [facility].   HPI/Subjective: Pt is sitting in bedside chair w/ legs dangling bellow him.  He denies current cp, but dose admit to mild SOB.  No n/v.  Very talkative but quite pleasant.    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  - management with diuretics + milrinone per CHF Team  Acute on chronic renal failure  - spoke w/ Nephrology - we agree there is no current indication for acute HD - they will be available prn via call back for consult - cont to attempt to control volume w/  diuresis   Chronic Afib - as per Cardiology - is on eqliquis at home - now on lovenox  Hyperkalemia  - responding to diuresis - follow trend - may soon have to give K+  Anemia of chronic disease  - no signs of active bleeding  - CBC in AM   HTN  - continue home medical regimen   Hyponatremia  - secondary to CHF - educated pt why "eating extra salt" is the exact opposite of what he should be doing - nutrition consult to educate on CHF diet  - repeat BMP in AM   CAD of artery bypass graft  - continue statin - no chest pain   Unspecified hypothyroidism  - continue synthroid   Code Status: FULL Family Communication: spoke w/ pt and sister at bedside  Disposition Plan: SDU for milrinone gtt   Consultants: CHF Team   Procedures: none  Antibiotics: none  DVT prophylaxis: lovenox   Objective: Blood pressure 125/50, pulse 77, temperature 97.9 F (36.6 C), temperature source Oral, resp. rate 27, height 5\' 9"  (1.753 m), weight 103 kg (227 lb 1.2 oz), SpO2 90.00%.  Intake/Output Summary (Last 24 hours) at 01/30/14 1033 Last data filed at 01/30/14 1008  Gross per 24 hour  Intake 634.43 ml  Output      0 ml  Net 634.43 ml   Exam: General: No acute respiratory distress at rest  Lungs: diffuse crackles - no wheeze  Cardiovascular: Regular rate and rhythm without murmur gallop  or rub  Abdomen: obese, nontender, soft, bowel sounds positive, no rebound, no appreciable mass Extremities: No significant cyanosis, or clubbing;  3+ edema bilateral lower extremities  Data Reviewed: Basic Metabolic Panel:  Recent Labs Lab 01/29/14 1910 01/30/14 0224  NA 132* 138  K 5.1 4.5  CL 89* 95*  CO2 23 24  GLUCOSE 207* 109*  BUN 97* 96*  CREATININE 3.53* 3.64*  CALCIUM 8.4 8.4  MG 2.0  --   PHOS 5.5*  --    Liver Function Tests:  Recent Labs Lab 01/29/14 1910  AST 24  ALT 21  ALKPHOS 63  BILITOT <0.2*  PROT 6.6  ALBUMIN 3.3*   CBC:  Recent Labs Lab  01/29/14 1910 01/30/14 0224  WBC 6.9 8.2  HGB 10.3* 9.6*  HCT 31.1* 29.2*  MCV 89.1 89.3  PLT 212 205   Cardiac Enzymes:  Recent Labs Lab 01/29/14 1910  TROPONINI <0.30   BNP (last 3 results)  Recent Labs  01/29/14 1910  PROBNP 24228.0*   CBG:  Recent Labs Lab 01/29/14 1152 01/29/14 1637 01/29/14 2210  GLUCAP 210* 272* 147*    Studies:  Recent x-ray studies have been reviewed in detail by the Attending Physician  Scheduled Meds:  Scheduled Meds: . enoxaparin (LOVENOX) injection  100 mg Subcutaneous QHS  . hydrALAZINE  12.5 mg Oral 3 times per day  . isosorbide dinitrate  10 mg Oral Q8H  . sodium chloride  3 mL Intravenous Q12H    Time spent on care of this patient: 35 mins   Lonia Blood , MD   [facility] Office  [phone] Pager - Text Page per Amion as per below:  On-Call/Text Page:      [email or web address]      password [email or web address]  If 7PM-7AM, please contact night-coverage [email or web address] Password [email or web address] 01/30/2014, 10:33 AM   LOS: 1 day

## 2014-01-30 NOTE — Progress Notes (Signed)
Subjective: +orthopnea.  Imporves when sitting up  Objective: Vital signs in last 24 hours: Temp:  [97.6 F (36.4 C)-98.1 F (36.7 C)] 97.9 F (36.6 C) (04/29 0733) Pulse Rate:  [68-77] 77 (04/29 0733) Resp:  [15-27] 27 (04/29 0733) BP: (109-140)/(46-71) 125/50 mmHg (04/29 0733) SpO2:  [90 %-99 %] 90 % (04/29 0733) Weight:  [226 lb 9.6 oz (102.785 kg)-229 lb 0.9 oz (103.9 kg)] 227 lb 1.2 oz (103 kg) (04/29 0400) Last BM Date: 01/29/14  Intake/Output from previous day: 04/28 0701 - 04/29 0700 In: 222.8 [I.V.:222.8] Out: -  Intake/Output this shift: Total I/O In: 411.6 [P.O.:360; I.V.:51.6] Out: -   Medications Current Facility-Administered Medications  Medication Dose Route Frequency Provider Last Rate Last Dose  . 0.9 %  sodium chloride infusion  250 mL Intravenous PRN Dorothea OgleIskra M Myers, MD      . albuterol (PROVENTIL) (2.5 MG/3ML) 0.083% nebulizer solution 2.5 mg  2.5 mg Nebulization Q4H PRN Roma KayserKatherine P Schorr, NP      . enoxaparin (LOVENOX) injection 100 mg  100 mg Subcutaneous QHS Crystal Stillinger BurkeRobertson, RPH      . furosemide (LASIX) 250 mg in dextrose 5 % 250 mL infusion  15 mg/hr Intravenous Continuous Nada BoozerLaura Ingold, NP 15 mL/hr at 01/30/14 0800 15 mg/hr at 01/30/14 0800  . hydrALAZINE (APRESOLINE) tablet 12.5 mg  12.5 mg Oral 3 times per day Nada BoozerLaura Ingold, NP   12.5 mg at 01/30/14 0521  . HYDROcodone-acetaminophen (NORCO/VICODIN) 5-325 MG per tablet 1-2 tablet  1-2 tablet Oral Q4H PRN Dorothea OgleIskra M Myers, MD      . HYDROmorphone (DILAUDID) injection 0.5 mg  0.5 mg Intravenous Q2H PRN Dorothea OgleIskra M Myers, MD      . isosorbide dinitrate (ISORDIL) tablet 10 mg  10 mg Oral Q8H Nada BoozerLaura Ingold, NP   10 mg at 01/30/14 1007  . milrinone (PRIMACOR) infusion 200 mcg/mL (0.2 mg/ml)  0.25 mcg/kg/min Intravenous Continuous Nada BoozerLaura Ingold, NP 7.8 mL/hr at 01/30/14 0800 0.25 mcg/kg/min at 01/30/14 0800  . ondansetron (ZOFRAN) tablet 4 mg  4 mg Oral Q6H PRN Dorothea OgleIskra M Myers, MD       Or  .  ondansetron [facility](ZOFRAN) injection 4 mg  4 mg Intravenous Q6H PRN Dorothea OgleIskra M Myers, MD      . sodium chloride 0.9 % injection 3 mL  3 mL Intravenous Q12H Dorothea OgleIskra M Myers, MD   3 mL at 01/30/14 1008  . sodium chloride 0.9 % injection 3 mL  3 mL Intravenous PRN Dorothea OgleIskra M Myers, MD        PE: General appearance: alert, cooperative and no distress Lungs: Bilateral rales.  No wheeze. Heart: regular rate and rhythm, S1, S2 normal, no murmur, click, rub or gallop Abdomen: +BS, and distension.  Nontender.  Extremities: 3+ pitting edema Pulses: 2+ and symmetric Skin: Warm and dry Neurologic: Grossly normal  Lab Results:   Recent Labs  01/29/14 1910 01/30/14 0224  WBC 6.9 8.2  HGB 10.3* 9.6*  HCT 31.1* 29.2*  PLT 212 205   BMET  Recent Labs  01/29/14 1910 01/30/14 0224  NA 132* 138  K 5.1 4.5  CL 89* 95*  CO2 23 24  GLUCOSE 207* 109*  BUN 97* 96*  CREATININE 3.53* 3.64*  CALCIUM 8.4 8.4    Assessment/Plan  73 yo male with COPD, DM, HTN, HLD, CAD with BiV ICD, Anemia of chronic disease, Hypothyroidism, CHF (last EF 10% on last 2 D ECHO in January 2015), hospitalized in [place] from  April 24,th 2015 - April 28th, 2015 and transferred to [facility] for further evaluation and management of CHF and failure to respond to medical therapy with Lasix IV. Pt explains he started to feel sick 2 weeks prior to admission to [facility] with main concern of progressively worsening shortness of breath, weight gain of 15 lbs (211 lbs at baseline --> 228 lbs on admission). He explains he has been non compliant with dietary restrictions- he was told his sodium was low so he ate more salt-, has reported 3-4 pillow orthopnea, dyspnea at rest and with exertion, no specific alleviating or aggravating factors. In addition, he reported productive cough of yellow sputum but this has now gotten better. Denies any chest pain.   While at [facility], he has been treated with Zithromax and Rocephin with BD's for COPD, he was  also place on Lasix drip, metolazone by Dr. Dulce Sellar (pt also started on Hydralazine, nitrates, low dose digoxin, fluid restriction) but has failed to respond to medical therapy and was therefore transferred to [facility]. He has seen Dr. Lowell Guitar in the past for renal failure and plan was to place AV fistula soon. Currently he does not have fistula.   Cardiac hx includes MI > 15 yrs ago with low EF and PCI to LAD. Since that time he has been followed for cardiomyopathy. He has recurrent decompensation due to non compliance or anemia or COPD exacerbation. He cannot use BB due to COPD and bronchospasm and no ACE or ARB due to chronic renal failure. He has BiV ICD with most recent battery change 2 years ago. He has atrial fib on eliquis.   Currently, SOB with talking. HOB elevated for comfort. BMP today Na 130, K+ 6.1; cl 91; TCO2 23; glucose 152; BUN 95; Cr 3.50; mag 1.8;ca+ 8.4; a gap 22 GFR 17 Pro BNP 30,300; troponin neg; H/H 10.5/30.8 WBC 7.0 plt 201  Heme + stool on the 27th  Principal Problem:   Acute respiratory failure with hypoxia Active Problems:   Shortness of breath   CHF (congestive heart failure), NYHA class IV   Acute on chronic renal failure   Hyperkalemia   Anemia of chronic disease   HTN (hypertension)   Unspecified hypothyroidism   Hyponatremia   CAD (coronary artery disease) of artery bypass graft  Plan:  Keep on milrinone.  Add metolazone 2.5 mg to lasix.  Transfer to SD 2H.  BP and HR stable.      LOS: 1 day    Wilburt Finlay PA-C 01/30/2014 10:34 AM

## 2014-01-30 NOTE — Care Management Note (Signed)
    Page 1 of 1   01/30/2014     2:51:24 PM CARE MANAGEMENT NOTE 01/30/2014  Patient:  [name] [name]   Account Number:  [id number]  Date Initiated:  01/30/2014  Documentation initiated by:  Junius Creamer  Subjective/Objective Assessment:   adm w heart failure     Action/Plan:   lives w fam, pcp dr [name] [name]   Anticipated DC Date:     Anticipated DC Plan:           Choice offered to / List presented to:             Status of service:   Medicare Important Message given?   (If response is "NO", the following Medicare IM given date fields will be blank) Date Medicare IM given:   Date Additional Medicare IM given:    Discharge Disposition:    Per UR Regulation:  Reviewed for med. necessity/level of care/duration of stay  If discussed at Long Length of Stay Meetings, dates discussed:    Comments:  4/29 1450 debbie [name] Luallen rn,bsn will moniter for dc needs as pt progresses.

## 2014-01-31 ENCOUNTER — Inpatient Hospital Stay (HOSPITAL_COMMUNITY): Payer: Medicare PPO

## 2014-01-31 DIAGNOSIS — D638 Anemia in other chronic diseases classified elsewhere: Secondary | ICD-10-CM

## 2014-01-31 DIAGNOSIS — E871 Hypo-osmolality and hyponatremia: Secondary | ICD-10-CM

## 2014-01-31 DIAGNOSIS — I10 Essential (primary) hypertension: Secondary | ICD-10-CM

## 2014-01-31 DIAGNOSIS — J9600 Acute respiratory failure, unspecified whether with hypoxia or hypercapnia: Secondary | ICD-10-CM

## 2014-01-31 DIAGNOSIS — E039 Hypothyroidism, unspecified: Secondary | ICD-10-CM

## 2014-01-31 DIAGNOSIS — I517 Cardiomegaly: Secondary | ICD-10-CM

## 2014-01-31 LAB — HEMOGLOBIN A1C
HEMOGLOBIN A1C: 6.1 % — AB (ref <5.7)
MEAN PLASMA GLUCOSE: 128 mg/dL — AB (ref <117)

## 2014-01-31 LAB — BASIC METABOLIC PANEL
BUN: 104 mg/dL — AB (ref 6–23)
CO2: 26 meq/L (ref 19–32)
CREATININE: 3.74 mg/dL — AB (ref 0.50–1.35)
Calcium: 8.6 mg/dL (ref 8.4–10.5)
Chloride: 90 mEq/L — ABNORMAL LOW (ref 96–112)
GFR calc Af Amer: 17 mL/min — ABNORMAL LOW (ref >90)
GFR calc non Af Amer: 15 mL/min — ABNORMAL LOW (ref >90)
Glucose, Bld: 89 mg/dL (ref 70–99)
Potassium: 4.1 mEq/L (ref 3.7–5.3)
Sodium: 133 mEq/L — ABNORMAL LOW (ref 137–147)

## 2014-01-31 LAB — COMPREHENSIVE METABOLIC PANEL
ALBUMIN: 3.2 g/dL — AB (ref 3.5–5.2)
ALT: 20 U/L (ref 0–53)
AST: 27 U/L (ref 0–37)
Alkaline Phosphatase: 67 U/L (ref 39–117)
BUN: 97 mg/dL — AB (ref 6–23)
CHLORIDE: 91 meq/L — AB (ref 96–112)
CO2: 25 mEq/L (ref 19–32)
Calcium: 8.3 mg/dL — ABNORMAL LOW (ref 8.4–10.5)
Creatinine, Ser: 3.71 mg/dL — ABNORMAL HIGH (ref 0.50–1.35)
GFR calc Af Amer: 17 mL/min — ABNORMAL LOW (ref >90)
GFR calc non Af Amer: 15 mL/min — ABNORMAL LOW (ref >90)
Glucose, Bld: 117 mg/dL — ABNORMAL HIGH (ref 70–99)
Potassium: 3.8 mEq/L (ref 3.7–5.3)
Sodium: 134 mEq/L — ABNORMAL LOW (ref 137–147)
TOTAL PROTEIN: 6.3 g/dL (ref 6.0–8.3)
Total Bilirubin: 0.2 mg/dL — ABNORMAL LOW (ref 0.3–1.2)

## 2014-01-31 LAB — CBC
HEMATOCRIT: 28.5 % — AB (ref 39.0–52.0)
Hemoglobin: 9.3 g/dL — ABNORMAL LOW (ref 13.0–17.0)
MCH: 29.2 pg (ref 26.0–34.0)
MCHC: 32.6 g/dL (ref 30.0–36.0)
MCV: 89.6 fL (ref 78.0–100.0)
Platelets: 180 10*3/uL (ref 150–400)
RBC: 3.18 MIL/uL — AB (ref 4.22–5.81)
RDW: 15 % (ref 11.5–15.5)
WBC: 6.4 10*3/uL (ref 4.0–10.5)

## 2014-01-31 LAB — APTT: aPTT: 40 seconds — ABNORMAL HIGH (ref 24–37)

## 2014-01-31 LAB — URINE CULTURE

## 2014-01-31 LAB — HEPARIN LEVEL (UNFRACTIONATED): Heparin Unfractionated: 0.51 IU/mL (ref 0.30–0.70)

## 2014-01-31 LAB — GLUCOSE, CAPILLARY
GLUCOSE-CAPILLARY: 123 mg/dL — AB (ref 70–99)
Glucose-Capillary: 87 mg/dL (ref 70–99)
Glucose-Capillary: 190 mg/dL — ABNORMAL HIGH (ref 70–99)

## 2014-01-31 MED ORDER — PRISMASOL BGK 4/2.5 32-4-2.5 MEQ/L IV SOLN
INTRAVENOUS | Status: DC
  Administered 2014-01-31 – 2014-02-03 (×5): via INTRAVENOUS_CENTRAL
  Filled 2014-01-31 (×9): qty 5000

## 2014-01-31 MED ORDER — BUDESONIDE-FORMOTEROL FUMARATE 80-4.5 MCG/ACT IN AERO
2.0000 | INHALATION_SPRAY | Freq: Every day | RESPIRATORY_TRACT | Status: DC
  Administered 2014-02-01 – 2014-02-09 (×8): 2 via RESPIRATORY_TRACT
  Filled 2014-01-31 (×2): qty 6.9

## 2014-01-31 MED ORDER — PRISMASOL BGK 4/2.5 32-4-2.5 MEQ/L IV SOLN
INTRAVENOUS | Status: DC
  Administered 2014-01-31 – 2014-02-03 (×18): via INTRAVENOUS_CENTRAL
  Filled 2014-01-31 (×26): qty 5000

## 2014-01-31 MED ORDER — HEPARIN (PORCINE) IN NACL 100-0.45 UNIT/ML-% IJ SOLN
1450.0000 [IU]/h | INTRAMUSCULAR | Status: DC
  Administered 2014-01-31: 1200 [IU]/h via INTRAVENOUS
  Administered 2014-02-01: 1350 [IU]/h via INTRAVENOUS
  Administered 2014-02-02 – 2014-02-03 (×3): 1450 [IU]/h via INTRAVENOUS
  Filled 2014-01-31 (×12): qty 250

## 2014-01-31 MED ORDER — HEPARIN SODIUM (PORCINE) 1000 UNIT/ML DIALYSIS
1000.0000 [IU] | INTRAMUSCULAR | Status: DC | PRN
  Administered 2014-02-03 (×2): 1200 [IU] via INTRAVENOUS_CENTRAL
  Filled 2014-01-31: qty 6
  Filled 2014-01-31: qty 3

## 2014-01-31 MED ORDER — PERFLUTREN LIPID MICROSPHERE
INTRAVENOUS | Status: AC
  Administered 2014-01-31: 2 mL
  Filled 2014-01-31: qty 10

## 2014-01-31 MED ORDER — METOLAZONE 5 MG PO TABS
5.0000 mg | ORAL_TABLET | Freq: Two times a day (BID) | ORAL | Status: DC
  Administered 2014-01-31 – 2014-02-01 (×3): 5 mg via ORAL
  Filled 2014-01-31 (×5): qty 1

## 2014-01-31 MED ORDER — SODIUM CHLORIDE 0.9 % FOR CRRT
INTRAVENOUS_CENTRAL | Status: DC | PRN
  Filled 2014-01-31: qty 1000

## 2014-01-31 NOTE — Progress Notes (Signed)
ANTICOAGULATION CONSULT NOTE   Pharmacy Consult for heparin Indication: atrial fibrillation  No Known Allergies  Patient Measurements: Height: 5\' 9"  (175.3 cm) Weight: 226 lb 8 oz (102.74 kg) IBW/kg (Calculated) : 70.7 Heparin Dosing Weight: 88kg  Vital Signs: Temp: 97.9 F (36.6 C) (04/30 0813) Temp src: Oral (04/30 0813) BP: 123/48 mmHg (04/30 0944) Pulse Rate: 68 (04/30 0813)  Labs:  Recent Labs  01/29/14 1910 01/30/14 0224 01/31/14 0326 01/31/14 0847  HGB 10.3* 9.6* 9.3*  --   HCT 31.1* 29.2* 28.5*  --   PLT 212 205 180  --   APTT  --   --   --  40*  HEPARINUNFRC  --   --   --  0.51  CREATININE 3.53* 3.64* 3.71* 3.74*  TROPONINI <0.30  --   --   --     Estimated Creatinine Clearance: 20.5 ml/min (by C-G formula based on Cr of 3.74).   Medical History: Past Medical History  Diagnosis Date  . Coronary artery disease   . Myocardial infarction   . Hypertension   . CHF (congestive heart failure)   . Shortness of breath   . Pacemaker   . Automatic implantable cardioverter-defibrillator in situ     st jude BiV ICD  . COPD (chronic obstructive pulmonary disease)   . Pneumonia     HX OF PNA  . Hypothyroidism   . 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 Chronic kidney disease   . GERD (gastroesophageal reflux disease)   . Arthritis   . Anemia   . Atrial fibrillation     rate controlled    Medications:  Prescriptions prior to admission  Medication Sig Dispense Refill  . albuterol (PROVENTIL HFA;VENTOLIN HFA) 108 (90 BASE) MCG/ACT inhaler Inhale 1-2 puffs into the lungs every 6 (six) hours as needed for wheezing or shortness of breath.      Marland Kitchen. apixaban (ELIQUIS) 5 MG TABS tablet Take 5 mg by mouth 2 (two) times daily.      Marland Kitchen. atorvastatin (LIPITOR) 40 MG tablet Take 80 mg by mouth daily.      Marland Kitchen. doxazosin (CARDURA) 2 MG tablet Take 2 mg by mouth daily.      . fexofenadine (ALLEGRA) 180 MG tablet Take 180 mg by mouth daily.      . Fluticasone  Furoate-Vilanterol (BREO ELLIPTA) 100-25 MCG/INH AEPB Inhale 1 application into the lungs daily.      . furosemide (LASIX) 80 MG tablet Take 160 mg by mouth 2 (two) times daily. Takes an extra 80 mg on Monday and Thursday      . gemfibrozil (LOPID) 600 MG tablet Take 600 mg by mouth 2 (two) times daily before a meal.      . hydrALAZINE (APRESOLINE) 25 MG tablet Take 25 mg by mouth 3 (three) times daily.      Marland Kitchen. ipratropium-albuterol (DUONEB) 0.5-2.5 (3) MG/3ML SOLN Take 3 mLs by nebulization every 4 (four) hours as needed.      . isosorbide dinitrate (ISORDIL) 20 MG tablet Take 20 mg by mouth 3 (three) times daily.      Marland Kitchen. levothyroxine (SYNTHROID, LEVOTHROID) 100 MCG tablet Take 100 mcg by mouth daily before breakfast.      . metFORMIN (GLUCOPHAGE) 850 MG tablet Take 850 mg by mouth 3 (three) times daily.      Marland Kitchen. omeprazole (PRILOSEC) 20 MG capsule Take 20 mg by mouth 2 (two) times daily before a meal.      .  oxybutynin (DITROPAN-XL) 10 MG 24 hr tablet Take 10 mg by mouth at bedtime.      Marland Kitchen oxyCODONE-acetaminophen (PERCOCET) 10-325 MG per tablet Take 1 tablet by mouth every 4 (four) hours as needed for pain.        Assessment: SOB 73 y/o M with complex PMH was transferred from [place] to [facility] for management of CHF after failing to respond to Lasix drip, metolazone, hydralazine, nitrates, digoxin. Last EF 1/15=10%. Patient has a h/o noncompliance and CKD (no ACE/ARB). Pt also in afib. Heme + stool noted (at [facility]) on 4/27.  Labs:  Scr 3.7 with calculated CrCl 20 Hgb 9.3  Angicoagulation: Pharmacy consulted to start heparin for afib, however noted pt took Eliquis 5mg  BID PTA thought to be a couple of days ago. He received full dose lovenox last night (100mg @11p ). Random anti-xa level was 0.51, aptt 40. No bleeding issues noted, will go ahead and start heparin now without bolus.  Goal of Therapy:  Heparin level 0.3-0.7 units/ml Monitor platelets by anticoagulation protocol: Yes   Plan:   Start IV heparin at 1200 units/hr Check heparin level in 8 hours Daily CBC/HL Follow for s/s of bleeding  Sheppard Coil PharmD., BCPS Clinical Pharmacist Pager [phone] 01/31/2014 11:55 AM

## 2014-01-31 NOTE — Consult Note (Signed)
Jiro Kiester 01/31/2014 Arita Miss Requesting Physician:  Shirlee Latch  Reason for Consult:  AoCKD, Volume overload, acute decompensative sytolic HF, diuretic refractory HPI:  69M (Sees Powell at [facility]) admitted with acute on chronic decompensated CHF with volume overload in setting of CKD4 (Last outpt SCr 2.31 12/2013), COPD, HTN, HLD, CAD, s/p BiV ICD, AFib, Anemia.  Originally presented to [facility] and failed diuretic management there for volume issues with worsening renal function transferred to [facility].     He is on high dose diuretics as outpt.  Does not follow clear Na restrictions.   Here at [facility] has failed to effectively diurese despite inc dose of lasixc gtt + addition of metolaze this AM.    No problems with urinating per pt.  No [place] report seen in [place] records and no [place] here.  Has hx/o BPH and [facility] notes include hx/o bladder outlet obstruction.    Creatinine, Ser (mg/dL)  Date Value  5/78/4696 3.74*  01/31/2014 3.71*  01/30/2014 3.64*  01/29/2014 3.53*  ] 12/13/13 SCr 2.31 at [facility]  I/Os: I/O last 3 completed shifts: In: 2658.8 [P.O.:1600; I.V.:1058.8] Out: 1150 [Urine:1150]   ROS NSAIDS: none IV Contrast none TMP/SMX none Hypotension: mild Balance of 12 systems is negative w/ exceptions as above  PMH  Past Medical History  Diagnosis Date  . Coronary artery disease   . Myocardial infarction   . Hypertension   . CHF (congestive heart failure)   . Shortness of breath   . Pacemaker   . Automatic implantable cardioverter-defibrillator in situ     st jude BiV ICD  . COPD (chronic obstructive pulmonary disease)   . Pneumonia     HX OF PNA  . Hypothyroidism   . 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 Chronic kidney disease   . GERD (gastroesophageal reflux disease)   . Arthritis   . Anemia   . Atrial fibrillation     rate controlled   PSH  Past Surgical History  Procedure Laterality Date  . Appendectomy    . Insert / replace / remove  pacemaker      st. Jude  . Coronary angioplasty     FH History reviewed. No pertinent family history. SH  reports that he quit smoking about 20 years ago. His smoking use included Cigarettes. He smoked 0.00 packs per day for 35 years. He has never used smokeless tobacco. He reports that he does not drink alcohol or use illicit drugs. Allergies No Known Allergies Home medications Prior to Admission medications   Medication Sig Start Date End Date Taking? Authorizing Provider  albuterol (PROVENTIL HFA;VENTOLIN HFA) 108 (90 BASE) MCG/ACT inhaler Inhale 1-2 puffs into the lungs every 6 (six) hours as needed for wheezing or shortness of breath.   Yes Historical Provider, MD  apixaban (ELIQUIS) 5 MG TABS tablet Take 5 mg by mouth 2 (two) times daily.   Yes Historical Provider, MD  atorvastatin (LIPITOR) 40 MG tablet Take 80 mg by mouth daily.   Yes Historical Provider, MD  doxazosin (CARDURA) 2 MG tablet Take 2 mg by mouth daily.   Yes Historical Provider, MD  fexofenadine (ALLEGRA) 180 MG tablet Take 180 mg by mouth daily.   Yes Historical Provider, MD  Fluticasone Furoate-Vilanterol (BREO ELLIPTA) 100-25 MCG/INH AEPB Inhale 1 application into the lungs daily.   Yes Historical Provider, MD  furosemide (LASIX) 80 MG tablet Take 160 mg by mouth 2 (two) times daily. Takes an extra 80  mg on Monday and Thursday   Yes Historical Provider, MD  gemfibrozil (LOPID) 600 MG tablet Take 600 mg by mouth 2 (two) times daily before a meal.   Yes Historical Provider, MD  hydrALAZINE (APRESOLINE) 25 MG tablet Take 25 mg by mouth 3 (three) times daily.   Yes Historical Provider, MD  ipratropium-albuterol (DUONEB) 0.5-2.5 (3) MG/3ML SOLN Take 3 mLs by nebulization every 4 (four) hours as needed.   Yes Historical Provider, MD  isosorbide dinitrate (ISORDIL) 20 MG tablet Take 20 mg by mouth 3 (three) times daily.   Yes Historical Provider, MD  levothyroxine (SYNTHROID, LEVOTHROID) 100 MCG tablet Take 100 mcg by mouth  daily before breakfast.   Yes Historical Provider, MD  metFORMIN (GLUCOPHAGE) 850 MG tablet Take 850 mg by mouth 3 (three) times daily.   Yes Historical Provider, MD  omeprazole (PRILOSEC) 20 MG capsule Take 20 mg by mouth 2 (two) times daily before a meal.   Yes Historical Provider, MD  oxybutynin (DITROPAN-XL) 10 MG 24 hr tablet Take 10 mg by mouth at bedtime.   Yes Historical Provider, MD  oxyCODONE-acetaminophen (PERCOCET) 10-325 MG per tablet Take 1 tablet by mouth every 4 (four) hours as needed for pain.   Yes Historical Provider, MD    Current Medications Current Facility-Administered Medications  Medication Dose Route Frequency Provider Last Rate Last Dose  . 0.9 %  sodium chloride infusion   Intravenous Continuous Lonia Blood, MD 10 mL/hr at 01/30/14 1900    . albuterol (PROVENTIL HFA;VENTOLIN HFA) 108 (90 BASE) MCG/ACT inhaler 2 puff  2 puff Inhalation Q4H PRN Lonia Blood, MD      . albuterol (PROVENTIL) (2.5 MG/3ML) 0.083% nebulizer solution 2.5 mg  2.5 mg Nebulization Q4H PRN Roma Kayser Schorr, NP      . albuterol (PROVENTIL) (2.5 MG/3ML) 0.083% nebulizer solution 3 mL  3 mL Inhalation QID Lonia Blood, MD   3 mL at 01/31/14 1519  . atorvastatin (LIPITOR) tablet 80 mg  80 mg Oral q1800 Lonia Blood, MD   80 mg at 01/30/14 1717  . [START ON 02/01/2014] budesonide-formoterol (SYMBICORT) 80-4.5 MCG/ACT inhaler 2 puff  2 puff Inhalation Daily Drema Dallas, MD      . doxazosin (CARDURA) tablet 2 mg  2 mg Oral Daily Lonia Blood, MD   2 mg at 01/31/14 0944  . furosemide (LASIX) 250 mg in dextrose 5 % 250 mL infusion  20 mg/hr Intravenous Continuous Laurey Morale, MD 20 mL/hr at 01/31/14 0745 20 mg/hr at 01/31/14 0745  . heparin ADULT infusion 100 units/mL (25000 units/250 mL)  1,200 Units/hr Intravenous Continuous Severiano Gilbert, [facility] 12 mL/hr at 01/31/14 1238 1,200 Units/hr at 01/31/14 1238  . hydrALAZINE (APRESOLINE) tablet 12.5 mg  12.5 mg Oral 3 times per  day [name], NP   12.5 mg at 01/31/14 1435  . HYDROcodone-acetaminophen (NORCO/VICODIN) 5-325 MG per tablet 1-2 tablet  1-2 tablet Oral Q4H PRN Dorothea Ogle, MD      . HYDROmorphone (DILAUDID) injection 0.5 mg  0.5 mg Intravenous Q2H PRN Dorothea Ogle, MD      . insulin aspart (novoLOG) injection 0-9 Units  0-9 Units Subcutaneous TID WC Lonia Blood, MD   1 Units at 01/31/14 1240  . insulin glargine (LANTUS) injection 10 Units  10 Units Subcutaneous QHS Lonia Blood, MD   10 Units at 01/30/14 2259  . isosorbide dinitrate (ISORDIL) tablet 10 mg  10 mg Oral  Q8H Leeann MustJacob Kelly, MD   10 mg at 01/31/14 1435  . levothyroxine (SYNTHROID, LEVOTHROID) tablet 100 mcg  100 mcg Oral QAC breakfast Lonia BloodJeffrey T McClung, MD   100 mcg at 01/31/14 0944  . metolazone (ZAROXOLYN) tablet 5 mg  5 mg Oral BID Laurey Moralealton S McLean, MD   5 mg at 01/31/14 0944  . milrinone (PRIMACOR) infusion 200 mcg/mL (0.2 mg/ml)  0.25 mcg/kg/min Intravenous Continuous Nada BoozerLaura Ingold, NP 7.8 mL/hr at 01/31/14 1434 0.25 mcg/kg/min at 01/31/14 1434  . ondansetron (ZOFRAN) tablet 4 mg  4 mg Oral Q6H PRN Dorothea OgleIskra M Myers, MD       Or  . ondansetron [facility](ZOFRAN) injection 4 mg  4 mg Intravenous Q6H PRN Dorothea OgleIskra M Myers, MD      . oxybutynin (DITROPAN-XL) 24 hr tablet 10 mg  10 mg Oral QHS Lonia BloodJeffrey T McClung, MD   10 mg at 01/30/14 2259    CBC  Recent Labs Lab 01/29/14 1910 01/30/14 0224 01/31/14 0326  WBC 6.9 8.2 6.4  HGB 10.3* 9.6* 9.3*  HCT 31.1* 29.2* 28.5*  MCV 89.1 89.3 89.6  PLT 212 205 180   Basic Metabolic Panel  Recent Labs Lab 01/29/14 1910 01/30/14 0224 01/31/14 0326 01/31/14 0847  NA 132* 138 134* 133*  K 5.1 4.5 3.8 4.1  CL 89* 95* 91* 90*  CO2 23 24 25 26   GLUCOSE 207* 109* 117* 89  BUN 97* 96* 97* 104*  CREATININE 3.53* 3.64* 3.71* 3.74*  CALCIUM 8.4 8.4 8.3* 8.6  PHOS 5.5*  --   --   --     Physical Exam  Blood pressure 98/39, pulse 68, temperature 97.5 F (36.4 C), temperature source Oral, resp. rate 18,  height 5\' 9"  (1.753 m), weight 102.74 kg (226 lb 8 oz), SpO2 98.00%. GEN: elderly male in chair, mild distress, clearly anxious ENT: NCAT, fair dentition EYES: EOMI, glasses CV: RRR PULM: bilateral crackles, some rhonci, mild inc WOB ABD: distended, soft SKIN: no rashes/lesions EXT:4+ pitting edema into proximal legs and across sacrum NEURO: nonfocal   Assessment 55F with acute decompensated systolic failure and AoCKD (BL 2.31, [facility]) now refractory to aggressive diuretics (lasix gtt, metolozaone + milrinone)  1. 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 A/C SHF on milrinone 3. Diuretic Refractory (failed lasix gtt at 20 + metolazone) 4. Azotemia, BUN 104 5. Anemia, normocytic 6. COPD  PLAN 1. Will start RRT with CRRT given tenuous hemodynamics, goal net negative 100-1750mL/hr 2. Will ask CCM to assist with HD catheter placement 3. After started on CRRT can discontinue diuretics 4. Check iron studies / B12 5. Renal US 6. Agree with foley catheter 7. No heparin AC since on systemic treatment   Sabra Heckyan [employer] MD 01/31/2014, 3:45 PM

## 2014-01-31 NOTE — Progress Notes (Addendum)
  Echocardiogram 2D Echocardiogram with Definity has been performed.  Jorje Guild 01/31/2014, 11:26 AM

## 2014-01-31 NOTE — Progress Notes (Signed)
Dr. Sung Amabile at bedside. He said it was ok to use HD catheter.

## 2014-01-31 NOTE — Progress Notes (Signed)
ANTICOAGULATION CONSULT NOTE - Follow Up Consult  Pharmacy Consult for Heparin Indication: atrial fibrillation  No Known Allergies  Patient Measurements: Height: 5\' 9"  (175.3 cm) Weight: 226 lb 8 oz (102.74 kg) IBW/kg (Calculated) : 70.7 Heparin Dosing Weight: 88 kg  Labs:  Recent Labs  01/29/14 1910 01/30/14 0224 01/31/14 0326 01/31/14 0847  HGB 10.3* 9.6* 9.3*  --   HCT 31.1* 29.2* 28.5*  --   PLT 212 205 180  --   APTT  --   --   --  40*  HEPARINUNFRC  --   --   --  0.51  CREATININE 3.53* 3.64* 3.71* 3.74*  TROPONINI <0.30  --   --   --     Estimated Creatinine Clearance: 20.5 ml/min (by C-G formula based on Cr of 3.74).  Assessment:   Eliquis on hold for procedures.  Time of last dose unknown, but thought to be a few days ago.  Received Lovenox 100 mg (~ 1 mg/kg) ~ 11pm on 4/29; heparin level was 0.5 this am, and heparin drip was begun ~12:30pm today.   Heparin level planned for 8pm tonight, but drip held ~6pm for placement of HD catheter by CCM.  CRRT to begin.  No heparin with CRRT orders due to systemic heparin.  Spoke briefly with Dr. Marchelle Gearing; ok to resume heparin now.   Heme + stool at [facility] on 4/27.   For iron studies 5/1.    Goal of Therapy:  Heparin level 0.3-0.7 units/ml Monitor platelets by anticoagulation protocol: Yes   Plan:   Resume heparin drip at prior rate of 1200 units/hr.  Heparin level and CBC in am and daily while on heparin.  Will follow up for any bleeding.   Add Protonix?  Guaiac stools?  Dennie Fetters, [place] Pager: [phone] 01/31/2014,10:48 PM

## 2014-01-31 NOTE — Procedures (Signed)
Hemodialysis Catheter Insertion Procedure Note [name] [name] [id number] November 01, 1940  Procedure: Insertion of Central HD Catheter Indications: CRRT  Procedure Details Consent: Risks of procedure as well as the alternatives and risks of each were explained to the (patient/caregiver).  Consent for procedure obtained. Time Out: Verified patient identification, verified procedure, site/side was marked, verified correct patient position, special equipment/implants available, medications/allergies/relevent history reviewed, required imaging and test results available.  Performed  Maximum sterile technique was used including antiseptics, cap, gloves, gown, hand hygiene, mask and sheet. Skin prep: Chlorhexidine; local anesthetic administered A antimicrobial bonded/coated triple lumen catheter was placed in the right internal jugular vein using the Seldinger technique.  Evaluation Blood flow good Complications: No apparent complications Patient did tolerate procedure well. Chest X-ray ordered to verify placement.  CXR: pending.  Procedure performed under direct US guidance.   [name] Guys, PA - [facility] Pgr: [phone]  or [phone]   I was present for and supervised the entire procedure  [name] Fischer, MD ; [facility] service Mobile [phone].  After 5:30 PM or weekends, call [phone]

## 2014-01-31 NOTE — Progress Notes (Signed)
Patient ID: [name] [name], male   DOB: 03-07-1941, 73 y.o.   MRN: [id number]   SUBJECTIVE: Poor diuresis yesterday, patient remains very volume overloaded.   Scheduled Meds: . albuterol  3 mL Inhalation QID  . atorvastatin  80 mg Oral q1800  . doxazosin  2 mg Oral Daily  . enoxaparin (LOVENOX) injection  100 mg Subcutaneous QHS  . Fluticasone Furoate-Vilanterol  1 application Inhalation Daily  . gemfibrozil  600 mg Oral BID AC  . hydrALAZINE  12.5 mg Oral 3 times per day  . insulin aspart  0-9 Units Subcutaneous TID WC  . insulin glargine  10 Units Subcutaneous QHS  . isosorbide dinitrate  10 mg Oral Q8H  . levothyroxine  100 mcg Oral QAC breakfast  . metolazone  5 mg Oral BID  . oxybutynin  10 mg Oral QHS   Continuous Infusions: . sodium chloride 10 mL/hr at 01/30/14 1900  . furosemide (LASIX) infusion 15 mg/hr (01/31/14 0647)  . milrinone 0.25 mcg/kg/min (01/31/14 0122)   PRN Meds:.albuterol, albuterol, HYDROcodone-acetaminophen, HYDROmorphone (DILAUDID) injection, ondansetron (ZOFRAN) IV, ondansetron    Filed Vitals:   01/30/14 2244 01/30/14 2325 01/31/14 0300 01/31/14 0648  BP: 114/51 116/38 117/52 124/46  Pulse: 70 70 70   Temp:  97.1 F (36.2 C) 97.9 F (36.6 C)   TempSrc:  Oral Oral   Resp: 22 18 98   Height:      Weight:   226 lb 8 oz (102.74 kg)   SpO2: 94% 97% 98%     Intake/Output Summary (Last 24 hours) at 01/31/14 0739 Last data filed at 01/31/14 0700  Gross per 24 hour  Intake   2436 ml  Output   1150 ml  Net   1286 ml    LABS: Basic Metabolic Panel:  Recent Labs  63/84/53 1910 01/30/14 0224 01/31/14 0326  NA 132* 138 134*  K 5.1 4.5 3.8  CL 89* 95* 91*  CO2 23 24 25   GLUCOSE 207* 109* 117*  BUN 97* 96* 97*  CREATININE 3.53* 3.64* 3.71*  CALCIUM 8.4 8.4 8.3*  MG 2.0  --   --   PHOS 5.5*  --   --    Liver Function Tests:  Recent Labs  01/29/14 1910 01/31/14 0326  AST 24 27  ALT 21 20  ALKPHOS 63 67  BILITOT <0.2* 0.2*  PROT  6.6 6.3  ALBUMIN 3.3* 3.2*   No results found for this basename: LIPASE, AMYLASE,  in the last 72 hours CBC:  Recent Labs  01/30/14 0224 01/31/14 0326  WBC 8.2 6.4  HGB 9.6* 9.3*  HCT 29.2* 28.5*  MCV 89.3 89.6  PLT 205 180   Cardiac Enzymes:  Recent Labs  01/29/14 1910  TROPONINI <0.30   BNP: No components found with this basename: POCBNP,  D-Dimer: No results found for this basename: DDIMER,  in the last 72 hours Hemoglobin A1C: No results found for this basename: HGBA1C,  in the last 72 hours Fasting Lipid Panel: No results found for this basename: CHOL, HDL, LDLCALC, TRIG, CHOLHDL, LDLDIRECT,  in the last 72 hours Thyroid Function Tests:  Recent Labs  01/29/14 1910  TSH 3.420   Anemia Panel: No results found for this basename: VITAMINB12, FOLATE, FERRITIN, TIBC, IRON, RETICCTPCT,  in the last 72 hours  RADIOLOGY: No results found.  PHYSICAL EXAM General: NAD Neck: JVP 12 cm, no thyromegaly or thyroid nodule.  Lungs: Crackles at bases bilaterally. CV: Nondisplaced PMI.  Heart regular S1/S2, no S3/S4,  no murmur. 2+ edema to thighs.  No carotid bruit.  Normal pedal pulses.  Abdomen: Soft, nontender, no hepatosplenomegaly, no distention.  Neurologic: Alert and oriented x 3.  Psych: Normal affect. Extremities: No clubbing or cyanosis.   TELEMETRY: Reviewed telemetry pt in underlying atrial fibrillation with v-pacing  ASSESSMENT AND PLAN: 1. CHF: Acute on chronic CHF, EF 10% per report (done in [place]). He has a CRT-D device.  He is massively volume overloaded on exam. He is now on milrinone 0.25 and Lasix gtt @ 15 mg/hr.  He got one dose metolazone yesterday.  Still poor diuresis and creatinine slowly rising. Suspect cardiorenal syndrome. Unfortunately, he is nearing the need for hemodialysis for volume removal.  - Continue milrinone at 0.25 - Increase Lasix to 20 mg/hr and add metolazone 5 mg bid.  If he does not show response this morning, I think he is  going to need to be dialyzed. 2. Atrial fibrillation: Patient appears to be in underlying atrial fibrillation with BiV pacing. Eliquis has been held for possible procedure (dialysis access).  I will cover him with heparin gtt for now. 3. AKI on CKD: See above discussion. 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 Creatinine up to 3.7.  Suspect he will need dialysis very soon. 4. CAD: Continue statin. Heparin gtt for now with atrial fibrillation. No chest pain.   Laurey MoraleDalton S Anshi Jalloh 01/31/2014 7:44 AM

## 2014-01-31 NOTE — Progress Notes (Signed)
[place] TEAM 1 - Stepdown/ICU TEAM Progress Note  Mayra ReelBernie Ferrone ZOX:[id number] DOB: 10/01/1941 DOA: 01/29/2014 PCP: Lerry Linerwight Williams  Admit HPI / Brief Narrative: 73 yo WM PMHx COPD, DM, HTN, HLD, CAD with pacemaker, Anemia of chronic disease, Hypothyroidism, CHF (last EF 10% on last 2 D ECHO in January 2015), [place] [facility] April [place]th 2015 - April 28th, 2015 and [facility] evaluation and management of CHF and failure to respond to medical therapy with Lasix. Pt explained he started to feel sick 2 weeks prior to admission [facility] with main concern of progressively worsening shortness of breath, weight gain of 15 lbs (211 lbs at baseline --> 228 lbs on admission). He explained he had been non compliant with dietary restrictions, was having reported 3-4 pillow orthopnea, and dyspnea at rest and with exertion.    [facility], he was treated with Zithromax and Rocephin with BD's for COPD.  He was also placed on a Lasix drip and metolazone by Dr. Dulce SellarMunley (pt also started on Hydralazine, nitrates, low dose digoxin, fluid restriction) but failed to respond to medical therapy and was therefore [facility].   HPI/Subjective: Pt is sitting up in chair- states can breath better when up. No resting dyspnea.  Assessment/Plan:  Acute respiratory failure with hypoxia  - secondary to severe systolic and diastolic CHF due to dietary noncompliance (was actually intentionally adding salt to his diet because he was told "his sodium was low" and he though it would help)- worsened by poor renal fnx and low output HF  Acute on chronic severe systolic CHF NYHA class IV  - management with diuretics + milrinone per CHF Team-despite high dose Lasix Cards suspecting cardiorenal syndrome (see below)-still with marked anasarca on exam -4/30 weight standing=102.74 kg, weight on admission =103.9 kg  Acute on chronic renal failure  -MD spoke w/ Nephrology 4/29 -  agreed there was no current indication for acute HD - 4/30 Cards concerned pt just not responding to high dose diuretics (Cardiorenal syndrome: low GFR and low CO)-Cards has informed the pt that HD likely only option to promote optimal diuresis- they will contact renal to re-evaluate  Chronic Afib - as per Cardiology - is on eqliquis at home - now on lovenox  Hyperkalemia  - responding to diuresis - follow trend - may soon have to give K+  Anemia of chronic disease  - no signs of active bleeding  - CBC in AM   HTN  - continue home medical regimen   Hyponatremia  - secondary to CHF - 4/29: educated pt why "eating extra salt" is the exact opposite of what he should be doing - nutrition consult to educate on CHF diet  - repeat BMP in AM   CAD of artery bypass graft  - continue statin - no chest pain   Unspecified hypothyroidism  - continue synthroid   Code Status: FULL Family Communication: spoke w/ pt at bedside  Disposition Plan: SDU for milrinone gtt   Consultants: CHF Team   Procedures: none  Antibiotics: none  DVT prophylaxis: lovenox   Objective: Blood pressure 120/95, pulse 68, temperature 97.5 F (36.4 C), temperature source Oral, resp. rate 18, height 5\' 9"  (1.753 m), weight 226 lb 8 oz (102.74 kg), SpO2 99.00%.  Intake/Output Summary (Last 24 hours) at 01/31/14 1415 Last data filed at 01/31/14 1300  Gross per 24 hour  Intake 2340.52 ml  Output   1400 ml  Net 940.52 ml  Exam: General: No acute respiratory distress at rest  Lungs: diffuse crackles - no wheeze  Cardiovascular: Regular rate and rhythm without murmur gallop or rub  Abdomen: obese, nontender, soft, bowel sounds positive, no rebound, no appreciable mass Extremities: No significant cyanosis, or clubbing;  3+ edema bilateral lower extremities extending up thighs to abdomen and back/chest  Data Reviewed: Basic Metabolic Panel:  Recent Labs Lab 01/29/14 1910 01/30/14 0224 01/31/14 0326  01/31/14 0847  NA 132* 138 134* 133*  K 5.1 4.5 3.8 4.1  CL 89* 95* 91* 90*  CO2 23 24 25 26   GLUCOSE 207* 109* 117* 89  BUN 97* 96* 97* 104*  CREATININE 3.53* 3.64* 3.71* 3.74*  CALCIUM 8.4 8.4 8.3* 8.6  MG 2.0  --   --   --   PHOS 5.5*  --   --   --    Liver Function Tests:  Recent Labs Lab 01/29/14 1910 01/31/14 0326  AST 24 27  ALT 21 20  ALKPHOS 63 67  BILITOT <0.2* 0.2*  PROT 6.6 6.3  ALBUMIN 3.3* 3.2*   CBC:  Recent Labs Lab 01/29/14 1910 01/30/14 0224 01/31/14 0326  WBC 6.9 8.2 6.4  HGB 10.3* 9.6* 9.3*  HCT 31.1* 29.2* 28.5*  MCV 89.1 89.3 89.6  PLT 212 205 180   Cardiac Enzymes:  Recent Labs Lab 01/29/14 1910  TROPONINI <0.30   BNP (last 3 results)  Recent Labs  01/29/14 1910  PROBNP 24228.0*   CBG:  Recent Labs Lab 01/30/14 1244 01/30/14 1705 01/30/14 2139 01/31/14 0817 01/31/14 1148  GLUCAP 214* 185* 124* 87 123*    Studies:  Recent x-ray studies have been reviewed in detail by the Attending Physician  Scheduled Meds:  Scheduled Meds: . albuterol  3 mL Inhalation QID  . atorvastatin  80 mg Oral q1800  . doxazosin  2 mg Oral Daily  . Fluticasone Furoate-Vilanterol  1 application Inhalation Daily  . gemfibrozil  600 mg Oral BID AC  . hydrALAZINE  12.5 mg Oral 3 times per day  . insulin aspart  0-9 Units Subcutaneous TID WC  . insulin glargine  10 Units Subcutaneous QHS  . isosorbide dinitrate  10 mg Oral Q8H  . levothyroxine  100 mcg Oral QAC breakfast  . metolazone  5 mg Oral BID  . oxybutynin  10 mg Oral QHS    Time spent on care of this patient: 35 mins   Russella Dar , ANP   [facility] Office  [phone] Pager - Text Page per Loretha Stapler as per below:  On-Call/Text Page:      [email or web address]      password [email or web address]  If 7PM-7AM, please contact night-coverage [email or web address] Password [email or web address] 01/31/2014, 2:15 PM   LOS: 2 days   Examed patient and discussed the assessment and plan with ANP Jill Side.  Discuss plan  with patient and answered all questions

## 2014-02-01 DIAGNOSIS — N19 Unspecified kidney failure: Secondary | ICD-10-CM

## 2014-02-01 DIAGNOSIS — N184 Chronic kidney disease, stage 4 (severe): Secondary | ICD-10-CM

## 2014-02-01 LAB — IRON AND TIBC
Iron: 51 ug/dL (ref 42–135)
Iron: 56 ug/dL (ref 42–135)
SATURATION RATIOS: 13 % — AB (ref 20–55)
SATURATION RATIOS: 15 % — AB (ref 20–55)
TIBC: 383 ug/dL (ref 215–435)
TIBC: 382 ug/dL (ref 215–435)
UIBC: 332 ug/dL (ref 125–400)
UIBC: 326 ug/dL (ref 125–400)

## 2014-02-01 LAB — CBC
HCT: 27.4 % — ABNORMAL LOW (ref 39.0–52.0)
Hemoglobin: 9 g/dL — ABNORMAL LOW (ref 13.0–17.0)
MCH: 29.2 pg (ref 26.0–34.0)
MCHC: 32.8 g/dL (ref 30.0–36.0)
MCV: 89 fL (ref 78.0–100.0)
PLATELETS: 181 10*3/uL (ref 150–400)
RBC: 3.08 MIL/uL — AB (ref 4.22–5.81)
RDW: 14.9 % (ref 11.5–15.5)
WBC: 5.7 10*3/uL (ref 4.0–10.5)

## 2014-02-01 LAB — RENAL FUNCTION PANEL
ALBUMIN: 3.2 g/dL — AB (ref 3.5–5.2)
ALBUMIN: 3.1 g/dL — AB (ref 3.5–5.2)
BUN: 76 mg/dL — ABNORMAL HIGH (ref 6–23)
BUN: 49 mg/dL — AB (ref 6–23)
CALCIUM: 8.5 mg/dL (ref 8.4–10.5)
CALCIUM: 8.2 mg/dL — AB (ref 8.4–10.5)
CO2: 27 mEq/L (ref 19–32)
CO2: 27 mEq/L (ref 19–32)
CREATININE: 2.8 mg/dL — AB (ref 0.50–1.35)
Chloride: 94 mEq/L — ABNORMAL LOW (ref 96–112)
Chloride: 96 mEq/L (ref 96–112)
Creatinine, Ser: 1.86 mg/dL — ABNORMAL HIGH (ref 0.50–1.35)
GFR calc Af Amer: 39 mL/min — ABNORMAL LOW (ref >90)
GFR calc non Af Amer: 34 mL/min — ABNORMAL LOW (ref >90)
GFR, EST AFRICAN AMERICAN: 24 mL/min — AB (ref >90)
GFR, EST NON AFRICAN AMERICAN: 21 mL/min — AB (ref >90)
Glucose, Bld: 120 mg/dL — ABNORMAL HIGH (ref 70–99)
Glucose, Bld: 149 mg/dL — ABNORMAL HIGH (ref 70–99)
POTASSIUM: 4.2 meq/L (ref 3.7–5.3)
Phosphorus: 4.3 mg/dL (ref 2.3–4.6)
Phosphorus: 3.2 mg/dL (ref 2.3–4.6)
Potassium: 4.1 mEq/L (ref 3.7–5.3)
SODIUM: 136 meq/L — AB (ref 137–147)
SODIUM: 137 meq/L (ref 137–147)

## 2014-02-01 LAB — GLUCOSE, CAPILLARY
GLUCOSE-CAPILLARY: 152 mg/dL — AB (ref 70–99)
GLUCOSE-CAPILLARY: 165 mg/dL — AB (ref 70–99)
Glucose-Capillary: 147 mg/dL — ABNORMAL HIGH (ref 70–99)
Glucose-Capillary: 172 mg/dL — ABNORMAL HIGH (ref 70–99)
Glucose-Capillary: 146 mg/dL — ABNORMAL HIGH (ref 70–99)

## 2014-02-01 LAB — VITAMIN B12: VITAMIN B 12: 200 pg/mL — AB (ref 211–911)

## 2014-02-01 LAB — HEPARIN LEVEL (UNFRACTIONATED)
HEPARIN UNFRACTIONATED: 0.32 [IU]/mL (ref 0.30–0.70)
Heparin Unfractionated: 0.22 IU/mL — ABNORMAL LOW (ref 0.30–0.70)

## 2014-02-01 LAB — FERRITIN: Ferritin: 29 ng/mL (ref 22–322)

## 2014-02-01 LAB — MAGNESIUM: Magnesium: 2.2 mg/dL (ref 1.5–2.5)

## 2014-02-01 MED ORDER — ASPIRIN 81 MG PO CHEW
81.0000 mg | CHEWABLE_TABLET | Freq: Every day | ORAL | Status: DC
  Administered 2014-02-01 – 2014-02-05 (×5): 81 mg via ORAL
  Filled 2014-02-01 (×5): qty 1

## 2014-02-01 MED ORDER — DARBEPOETIN ALFA-POLYSORBATE 100 MCG/0.5ML IJ SOLN
100.0000 ug | INTRAMUSCULAR | Status: DC
  Administered 2014-02-08: 100 ug via SUBCUTANEOUS
  Filled 2014-02-01: qty 0.5

## 2014-02-01 MED ORDER — HYDRALAZINE HCL 25 MG PO TABS
25.0000 mg | ORAL_TABLET | Freq: Three times a day (TID) | ORAL | Status: DC
  Administered 2014-02-01 – 2014-02-05 (×11): 25 mg via ORAL
  Filled 2014-02-01 (×15): qty 1

## 2014-02-01 MED ORDER — DOCUSATE SODIUM 100 MG PO CAPS
100.0000 mg | ORAL_CAPSULE | Freq: Every day | ORAL | Status: DC
  Administered 2014-02-01 – 2014-02-11 (×9): 100 mg via ORAL
  Filled 2014-02-01 (×11): qty 1

## 2014-02-01 MED ORDER — DEXTROSE 5 % IV SOLN: 1.5000 g | INTRAVENOUS | Status: DC

## 2014-02-01 NOTE — Progress Notes (Signed)
Pharmacist Heart Failure Core Measure Documentation  Assessment: Kashad Harney has an EF documented as 15% on 01/31/14 by echo.  Rationale: Heart failure patients with left ventricular systolic dysfunction (LVSD) and an EF < 40% should be prescribed an angiotensin converting enzyme inhibitor (ACEI) or angiotensin receptor blocker (ARB) at discharge unless a contraindication is documented in the medical record.  This patient is not currently on an ACEI or ARB for HF.  This note is being placed in the record in order to provide documentation that a contraindication to the use of these agents is present for this encounter.  ACE Inhibitor or Angiotensin Receptor Blocker is contraindicated (specify all that apply)  []   ACEI allergy AND ARB allergy []   Angioedema []   Moderate or severe aortic stenosis []   Hyperkalemia []   Hypotension []   Renal artery stenosis [x]   Worsening renal function, preexisting renal disease or dysfunction   [name] [name] 02/01/2014 2:51 PM

## 2014-02-01 NOTE — Progress Notes (Signed)
Patient ID: [name] [name], male   DOB: 11-10-1940, 73 y.o.   MRN: [id number]   SUBJECTIVE: Started on CVVHD yesterday. Weight unchanged. 24 hr I/O -700 cc. Cr trending down 2.8. SBP 90-120s. ECHO EF 15-20%.    Scheduled Meds: . albuterol  3 mL Inhalation QID  . atorvastatin  80 mg Oral q1800  . budesonide-formoterol  2 puff Inhalation Daily  . doxazosin  2 mg Oral Daily  . hydrALAZINE  12.5 mg Oral 3 times per day  . insulin aspart  0-9 Units Subcutaneous TID WC  . insulin glargine  10 Units Subcutaneous QHS  . isosorbide dinitrate  10 mg Oral Q8H  . levothyroxine  100 mcg Oral QAC breakfast  . metolazone  5 mg Oral BID  . oxybutynin  10 mg Oral QHS   Continuous Infusions: . sodium chloride 5 mL/hr at 02/01/14 0700  . furosemide (LASIX) infusion 20 mg/hr (02/01/14 0700)  . heparin 1,350 Units/hr (02/01/14 0700)  . milrinone 0.25 mcg/kg/min (02/01/14 0700)  . dialysis replacement fluid (prismasate) 500 mL/hr at 01/31/14 2143  . dialysis replacement fluid (prismasate) 500 mL/hr at 01/31/14 2142  . dialysate (PRISMASATE) 1,500 mL/hr at 02/01/14 0512   PRN Meds:.albuterol, albuterol, heparin, HYDROcodone-acetaminophen, HYDROmorphone (DILAUDID) injection, ondansetron (ZOFRAN) IV, ondansetron, sodium chloride    Filed Vitals:   02/01/14 0400 02/01/14 0500 02/01/14 0600 02/01/14 0700  BP: 126/55 121/48 118/37 116/55  Pulse: 69 70 69 70  Temp: 96.5 F (35.8 C)     TempSrc: Oral     Resp:      Height:      Weight:  226 lb 6.6 oz (102.7 kg)    SpO2: 98% 96% 94% 96%    Intake/Output Summary (Last 24 hours) at 02/01/14 0750 Last data filed at 02/01/14 0700  Gross per 24 hour  Intake 2053.1 ml  Output   2775 ml  Net -721.9 ml    LABS: Basic Metabolic Panel:  Recent Labs  98/33/82 1910  01/31/14 0847 02/01/14 0500  NA 132*  < > 133* 136*  K 5.1  < > 4.1 4.1  CL 89*  < > 90* 94*  CO2 23  < > 26 27  GLUCOSE 207*  < > 89 120*  BUN 97*  < > 104* 76*  CREATININE 3.53*   < > 3.74* 2.80*  CALCIUM 8.4  < > 8.6 8.5  MG 2.0  --   --  2.2  PHOS 5.5*  --   --  4.3  < > = values in this interval not displayed. Liver Function Tests:  Recent Labs  01/29/14 1910 01/31/14 0326 02/01/14 0500  AST 24 27  --   ALT 21 20  --   ALKPHOS 63 67  --   BILITOT <0.2* 0.2*  --   PROT 6.6 6.3  --   ALBUMIN 3.3* 3.2* 3.2*   No results found for this basename: LIPASE, AMYLASE,  in the last 72 hours CBC:  Recent Labs  01/31/14 0326 02/01/14 0500  WBC 6.4 5.7  HGB 9.3* 9.0*  HCT 28.5* 27.4*  MCV 89.6 89.0  PLT 180 181   Cardiac Enzymes:  Recent Labs  01/29/14 1910  TROPONINI <0.30   BNP: No components found with this basename: POCBNP,  D-Dimer: No results found for this basename: DDIMER,  in the last 72 hours Hemoglobin A1C:  Recent Labs  01/31/14 0326  HGBA1C 6.1*   Fasting Lipid Panel: No results found for this basename: CHOL, HDL,  LDLCALC, TRIG, CHOLHDL, LDLDIRECT,  in the last 72 hours Thyroid Function Tests:  Recent Labs  01/29/14 1910  TSH 3.420   Anemia Panel: No results found for this basename: VITAMINB12, FOLATE, FERRITIN, TIBC, IRON, RETICCTPCT,  in the last 72 hours  RADIOLOGY: No results found.  PHYSICAL EXAM General: NAD Neck: JVP 12 cm, no thyromegaly or thyroid nodule. RIJ Lungs: Crackles at bases bilaterally. CV: Nondisplaced PMI.  Heart regular S1/S2, no S3/S4, no murmur. 2+ edema to thighs.  No carotid bruit.  Normal pedal pulses.  Abdomen: Soft, nontender, no hepatosplenomegaly, no distention.  Neurologic: Alert and oriented x 3.  Psych: Normal affect. Extremities: No clubbing or cyanosis.   TELEMETRY: Reviewed telemetry pt in underlying atrial fibrillation with v-pacing  ASSESSMENT AND PLAN: 1. 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 He has a CRT-D device. He remians massively volume overloaded on exam. Started on CVVHD yesterday. Will stop milrinone and lasix gtt volume being addressed by CVVHD. Maybe beneficial to  consult palliative care for goals of care.  2. Atrial fibrillation: Patient appears to be in underlying atrial fibrillation with BiV pacing. Eliquis on hold. On heparin gtt 3. AKI on CKD: Suspect cardiorenal syndrome probably superimposed on diabetic nephropathy. On CVVHD now managed by nephrology. 4. CAD: Continue statin. Heparin gtt for now with atrial fibrillation. No chest pain.   Aundria Rudli B Cosgrove NP-C 02/01/2014 7:50 AM  Patient seen with NP, agree with the above note.  He is tolerating CVVH well. BP stable.  He still has a lot of volume to go.  He is now off Lasix, metolazone, and milrinone.  Will eventually need to get him on a low dose beta blocker if BP remains stable. Continue heparin gtt for atrial fibrillation, now that he is getting renal replacement therapy will need to eventually get him on warfarin (had been on Eliquis).    Laurey MoraleDalton S Maanav Kassabian 02/01/2014 11:31 AM

## 2014-02-01 NOTE — Consult Note (Signed)
[facility] Consult  Reason for Consult:  In need of diatek and permanent HD access Referring Physician:  Hyman HopesWebb MRN #:  [id number]  History of Present Illness: This is a 73 y.o. male who was hospitalized at [facility] with CHF with progressing worsening SOB, 15lb weight gain, orthopnea and dyspnea at rest and exertion.  He did have a productive cough with yellow sputum, but Riley Nearingthi has improved.  [facility] was [facility] for [facility] management of CHF.  He was also found to be volume overloaded with acute decompensated systolic HF and worsening renal function.  He has an EF of 15-20%.  He was started on CVVHD yesterday.  His home medications include Eliquis, but this was discontinued and he is now on a heparin gtt.  He is on a statin for his cholesterol.  He is on Metformin for DM.  He does have a PPM/defibrillator.  Remote tobacco use.  Pt lives independently and still drives and cares for himself.  States he enjoys living and wants to live.  VVS is consulted for HD access and diatek catheter placement.   Past Medical History  Diagnosis Date  . Coronary artery disease   . Myocardial infarction   . Hypertension   . CHF (congestive heart failure)   . Shortness of breath   . Pacemaker   . Automatic implantable cardioverter-defibrillator in situ     st jude BiV ICD  . COPD (chronic obstructive pulmonary disease)   . Pneumonia     HX OF PNA  . Hypothyroidism   . 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 Chronic kidney disease   . GERD (gastroesophageal reflux disease)   . Arthritis   . Anemia   . Atrial fibrillation     rate controlled   Past Surgical History  Procedure Laterality Date  . Appendectomy    . Insert / replace / remove pacemaker      st. Jude  . Coronary angioplasty      No Known Allergies  Prior to Admission medications   Medication Sig Start Date End Date Taking? Authorizing Provider  albuterol (PROVENTIL  HFA;VENTOLIN HFA) 108 (90 BASE) MCG/ACT inhaler Inhale 1-2 puffs into the lungs every 6 (six) hours as needed for wheezing or shortness of breath.   Yes Historical Provider, MD  apixaban (ELIQUIS) 5 MG TABS tablet Take 5 mg by mouth 2 (two) times daily.   Yes Historical Provider, MD  atorvastatin (LIPITOR) 40 MG tablet Take 80 mg by mouth daily.   Yes Historical Provider, MD  doxazosin (CARDURA) 2 MG tablet Take 2 mg by mouth daily.   Yes Historical Provider, MD  fexofenadine (ALLEGRA) 180 MG tablet Take 180 mg by mouth daily.   Yes Historical Provider, MD  Fluticasone Furoate-Vilanterol (BREO ELLIPTA) 100-25 MCG/INH AEPB Inhale 1 application into the lungs daily.   Yes Historical Provider, MD  furosemide (LASIX) 80 MG tablet Take 160 mg by mouth 2 (two) times daily. Takes an extra 80 mg on Monday and Thursday   Yes Historical Provider, MD  gemfibrozil (LOPID) 600 MG tablet Take 600 mg by mouth 2 (two) times daily before a meal.   Yes Historical Provider, MD  hydrALAZINE (APRESOLINE) 25 MG tablet Take 25 mg by mouth 3 (three) times daily.   Yes Historical Provider, MD  ipratropium-albuterol (DUONEB) 0.5-2.5 (3) MG/3ML SOLN Take 3 mLs by nebulization every 4 (four) hours as needed.   Yes Historical Provider, MD  isosorbide dinitrate (ISORDIL) 20 MG tablet Take 20 mg by mouth 3 (three) times daily.   Yes Historical Provider, MD  levothyroxine (SYNTHROID, LEVOTHROID) 100 MCG tablet Take 100 mcg by mouth daily before breakfast.   Yes Historical Provider, MD  metFORMIN (GLUCOPHAGE) 850 MG tablet Take 850 mg by mouth 3 (three) times daily.   Yes Historical Provider, MD  omeprazole (PRILOSEC) 20 MG capsule Take 20 mg by mouth 2 (two) times daily before a meal.   Yes Historical Provider, MD  oxybutynin (DITROPAN-XL) 10 MG 24 hr tablet Take 10 mg by mouth at bedtime.   Yes Historical Provider, MD  oxyCODONE-acetaminophen (PERCOCET) 10-325 MG per tablet Take 1 tablet by mouth every 4 (four) hours as needed for  pain.   Yes Historical Provider, MD    History   Social History  . Marital Status: Widowed    Spouse Name: N/A    Number of Children: N/A  . Years of Education: N/A   Occupational History  . Not on file.   Social History Main Topics  . Smoking status: Former Smoker -- 35 years    Types: Cigarettes    Quit date: 10/04/1993  . Smokeless tobacco: Never Used  . Alcohol Use: No  . Drug Use: No  . Sexual Activity: Not on file   Other Topics Concern  . Not on file   Social History Narrative  . No narrative on file    Family History: Mother died in her 71's with hx of HTN Father died age 88  ROS: [x]  Positive   [ ]  Negative   [ ]  All sytems reviewed and are negative  Cardiovascular: []  chest pain/pressure [x]  hx MI []  palpitations [x]  SOB lying flat-orthopnea [x]  DOE []  pain in legs while walking []  pain in legs at rest []  pain in legs at night []  non-healing ulcers []  hx of DVT [x]  swelling in legs  Pulmonary: []  productive cough []  asthma/wheezing []  home O2  Neurologic: []  weakness in []  arms []  legs []  numbness in []  arms []  legs []  hx of CVA []  mini stroke [] difficulty speaking or slurred speech []  temporary loss of vision in one eye []  dizziness  Hematologic: []  hx of cancer []  bleeding problems []  problems with blood clotting easily  Endocrine:   [x]  diabetes [x]  thyroid disease  GI []  vomiting blood []  blood in stool [x]  GERD  GU: [x]  CKD/renal failure []  HD--[]  M/W/F or []  T/T/S []  burning with urination []  blood in urine  Psychiatric: []  anxiety []  depression  Musculoskeletal: []  arthritis []  joint pain  Integumentary: []  rashes []  ulcers  Constitutional: []  fever []  chills   Physical Examination  Filed Vitals:   02/01/14 1100  BP: 133/42  Pulse: 70  Temp:   Resp:    Body mass index is 33.42 kg/(m^2).  General:  WDWN in NAD Gait: Not observed HENT: WNL, normocephalic Eyes: Pupils equal Pulmonary: normal  non-labored breathing, without Rales, rhonchi,  Bilateral exp wheezes Cardiac: regular, without  Murmurs, rubs or gallops; without left carotid bruits (right IJ in place and cannot listen) Abdomen: soft, NT/ND, no masses Skin: without rashes, without ulcers  Vascular Exam/Pulses:  Right Left  Radial 2+ (normal) 2+ (normal)  Ulnar 1+ (weak) 1+ (weak)  Femoral 2+ (normal) 2+ (normal)  Popliteal Non palpable Non palpable  DP 1+ (weak) 1+ (weak)  PT Non palpable Non palpable   Extremities: without ischemic changes, without Gangrene , without cellulitis; without open wounds;  2+  pitting edema BLE Musculoskeletal: no muscle wasting or atrophy  Neurologic: A&O X 3; Appropriate Affect ; SENSATION: normal; MOTOR FUNCTION:  moving all extremities equally. Speech is fluent/normal Psychiatric:  Normal affect Lymph:  No inguinal lymphadenopathy   CBC    Component Value Date/Time   WBC 5.7 02/01/2014 0500   RBC 3.08* 02/01/2014 0500   HGB 9.0* 02/01/2014 0500   HCT 27.4* 02/01/2014 0500   PLT 181 02/01/2014 0500   MCV 89.0 02/01/2014 0500   MCH 29.2 02/01/2014 0500   MCHC 32.8 02/01/2014 0500   RDW 14.9 02/01/2014 0500    BMET    Component Value Date/Time   NA 136* 02/01/2014 0500   K 4.1 02/01/2014 0500   CL 94* 02/01/2014 0500   CO2 27 02/01/2014 0500   GLUCOSE 120* 02/01/2014 0500   BUN 76* 02/01/2014 0500   CREATININE 2.80* 02/01/2014 0500   CALCIUM 8.5 02/01/2014 0500   GFRNONAA 21* 02/01/2014 0500   GFRAA 24* 02/01/2014 0500    COAGS: No results found for this basename: INR, PROTIME     Non-Invasive Vascular Imaging:  Vein mapping ordered  Statin:  yes Beta Blocker:  no Aspirin:  no ACEI:  no ARB:  no Other antiplatelets/anticoagulants:  yes Eliquis (not reordered-now on heparin gtt)   ASSESSMENT: This is a 73 y.o. male with worsening renal function and CHF  PLAN: -will order BUE vein mapping -will place on OR schedule for Monday for diatek and permanent HD access by Dr.  Leane Call, PA-C [facility] [phone]  History and exam details as above.  Will get vein mapping but most likely use left arm since right hand dominant.  Fabienne Bruns, MD [facility] [place] Office: [phone] Pager: [phone]

## 2014-02-01 NOTE — Progress Notes (Signed)
ANTICOAGULATION CONSULT NOTE - Follow Up Consult  Pharmacy Consult for Heparin  Indication: atrial fibrillation  No Known Allergies  Patient Measurements: Height: 5\' 9"  (175.3 cm) Weight: 226 lb 6.6 oz (102.7 kg) IBW/kg (Calculated) : 70.7 Heparin Dosing Weight: 88 kg  Vital Signs: Temp: 97.4 F (36.3 C) (05/01 1200) Temp src: Oral (05/01 1200) BP: 112/50 mmHg (05/01 1500) Pulse Rate: 70 (05/01 1500)  Labs:  Recent Labs  01/29/14 1910 01/30/14 0224 01/31/14 0326 01/31/14 0847 02/01/14 0500 02/01/14 1430  HGB 10.3* 9.6* 9.3*  --  9.0*  --   HCT 31.1* 29.2* 28.5*  --  27.4*  --   PLT 212 205 180  --  181  --   APTT  --   --   --  40*  --   --   HEPARINUNFRC  --   --   --  0.51 0.22* 0.32  CREATININE 3.53* 3.64* 3.71* 3.74* 2.80*  --   TROPONINI <0.30  --   --   --   --   --    Estimated Creatinine Clearance: 27.3 ml/min (by C-G formula based on Cr of 2.8).  Medications:  Heparin 1350 units/hr  Assessment: 73 y/o M on heparin for afib. HL is 0.32 after rate adjustment this am. Other labs as above. No issues per RN. Will increase rate to keep within therapeutic range.  Goal of Therapy:  Heparin level 0.3-0.7 units/ml Monitor platelets by anticoagulation protocol: Yes   Plan:  -Increase heparin level to 1450 units/hr -Daily CBC/HL -Monitor for bleeding  Severiano Gilbert 02/01/2014,3:45 PM

## 2014-02-01 NOTE — Progress Notes (Signed)
ANTICOAGULATION CONSULT NOTE - Follow Up Consult  Pharmacy Consult for Heparin  Indication: atrial fibrillation  No Known Allergies  Patient Measurements: Height: 5\' 9"  (175.3 cm) Weight: 226 lb 6.6 oz (102.7 kg) IBW/kg (Calculated) : 70.7 Heparin Dosing Weight: 88 kg  Vital Signs: Temp: 96.5 F (35.8 C) (05/01 0400) Temp src: Oral (05/01 0400) BP: 121/48 mmHg (05/01 0500) Pulse Rate: 70 (05/01 0500)  Labs:  Recent Labs  01/29/14 1910 01/30/14 0224 01/31/14 0326 01/31/14 0847 02/01/14 0500  HGB 10.3* 9.6* 9.3*  --  9.0*  HCT 31.1* 29.2* 28.5*  --  27.4*  PLT 212 205 180  --  181  APTT  --   --   --  40*  --   HEPARINUNFRC  --   --   --  0.51 0.22*  CREATININE 3.53* 3.64* 3.71* 3.74*  --   TROPONINI <0.30  --   --   --   --    Estimated Creatinine Clearance: 20.5 ml/min (by C-G formula based on Cr of 3.74).  Medications:  Heparin 1200 units/hr  Assessment: 73 y/o M on heparin for afib. HL is 0.22. Other labs as above. No issues per RN.   Goal of Therapy:  Heparin level 0.3-0.7 units/ml Monitor platelets by anticoagulation protocol: Yes   Plan:  -Increase heparin level to 1350 units/hr -1400 HL -Daily CBC/HL -Monitor for bleeding  Abran Duke 02/01/2014,5:51 AM

## 2014-02-01 NOTE — Progress Notes (Addendum)
VASCULAR LAB PRELIMINARY  PRELIMINARY  PRELIMINARY  PRELIMINARY   Right upper extremity vein mapping not performed.  The patient preferred I look at the left arm because he is right-handed and he was getting dialysis at the time in the right side of his neck.     Left Upper Extremity Vein Map    Cephalic  Segment Diameter Depth Comment  1. Proximal upper arm mm mm Too small to measure  2. Mid upper arm mm mm Too small to measure  3. Above AC 1.5 mm mm   4. In AC 5.1 mm mm   5. Below AC 3.3 mm mm   6. Mid forearm 3.7 mm mm   7. Above wrist 2.2 mm mm    mm mm    mm mm    mm mm    Basilic  Segment Diameter Depth Comment  1. Proximal upper arm 5.8 mm 13.5 mm   2. Mid upper arm 4.1 mm 11.8 mm   3. Above AC 4.3 mm 5.7 mm   4. In AC 4.1 mm 4.1 mm Branch   5. Below AC mm mm Unable to follow after confluence with the [facility] comm vein.                             Smiley Houseman, RVT 02/01/2014, 4:38 PM

## 2014-02-01 NOTE — Progress Notes (Signed)
[facility] ROUNDING NOTE   Subjective:   Interval History:  Comfortable on CVVHDF     Objective:  Vital signs in last 24 hours:  Temp:  [96.5 F (35.8 C)-98.1 F (36.7 C)] 97.5 F (36.4 C) (05/01 0800) Pulse Rate:  [69-71] 70 (05/01 1100) Resp:  [16-20] 16 (04/30 2000) BP: (98-133)/(37-66) 133/42 mmHg (05/01 1100) SpO2:  [88 %-99 %] 97 % (05/01 1125) Weight:  [102.7 kg (226 lb 6.6 oz)] 102.7 kg (226 lb 6.6 oz) (05/01 0500)  Weight change: -0.04 kg (-1.4 oz) Filed Weights   01/30/14 0400 01/31/14 0300 02/01/14 0500  Weight: 103 kg (227 lb 1.2 oz) 102.74 kg (226 lb 8 oz) 102.7 kg (226 lb 6.6 oz)    Intake/Output: I/O last 3 completed shifts: In: 3046.7 [P.O.:1660; I.V.:1386.7] Out: 3725 [Urine:2875; Other:850]   Intake/Output this shift:  Total I/O In: 59.8 [I.V.:59.8] Out: 807 [Other:807]  CVS- irregular RS- CTA dull at bases ABD- BS present soft non-distended EXT- 2+ edema   Basic Metabolic Panel:  Recent Labs Lab 01/29/14 1910 01/30/14 0224 01/31/14 0326 01/31/14 0847 02/01/14 0500  NA 132* 138 134* 133* 136*  K 5.1 4.5 3.8 4.1 4.1  CL 89* 95* 91* 90* 94*  CO2 23 24 25 26 27   GLUCOSE 207* 109* 117* 89 120*  BUN 97* 96* 97* 104* 76*  CREATININE 3.53* 3.64* 3.71* 3.74* 2.80*  CALCIUM 8.4 8.4 8.3* 8.6 8.5  MG 2.0  --   --   --  2.2  PHOS 5.5*  --   --   --  4.3    Liver Function Tests:  Recent Labs Lab 01/29/14 1910 01/31/14 0326 02/01/14 0500  AST 24 27  --   ALT 21 20  --   ALKPHOS 63 67  --   BILITOT <0.2* 0.2*  --   PROT 6.6 6.3  --   ALBUMIN 3.3* 3.2* 3.2*   No results found for this basename: LIPASE, AMYLASE,  in the last 168 hours No results found for this basename: AMMONIA,  in the last 168 hours  CBC:  Recent Labs Lab 01/29/14 1910 01/30/14 0224 01/31/14 0326 02/01/14 0500  WBC 6.9 8.2 6.4 5.7  HGB 10.3* 9.6* 9.3* 9.0*  HCT 31.1* 29.2* 28.5* 27.4*  MCV 89.1 89.3 89.6 89.0  PLT 212 205 180 181     Cardiac Enzymes:  Recent Labs Lab 01/29/14 1910  TROPONINI <0.30    BNP: No components found with this basename: POCBNP,   CBG:  Recent Labs Lab 01/31/14 0817 01/31/14 1148 01/31/14 1634 01/31/14 2157 02/01/14 0855  GLUCAP 87 123* 190* 152* 147*    Microbiology: Results for orders placed during the hospital encounter of 01/29/14  URINE CULTURE     Status: None   Collection Time    01/30/14  2:44 AM      Result Value Ref Range Status   Specimen Description URINE, RANDOM   Final   Special Requests NONE   Final   Culture  Setup Time     Final   Value: 01/30/2014 03:47     Performed at [facility]   Colony Count     Final   Value: 20,OOO COLONIES/ML     Performed at [facility]   Culture     Final   Value: Multiple bacterial morphotypes present, none predominant. Suggest appropriate recollection if clinically indicated.     Performed at [facility]   Report Status 01/31/2014 FINAL  Final    Coagulation Studies: No results found for this basename: LABPROT, INR,  in the last 72 hours  Urinalysis:  Recent Labs  01/30/14 0244  COLORURINE YELLOW  YELLOW  LABSPEC 1.027  1.016  PHURINE 6.0  5.0  GLUCOSEU 250*  NEGATIVE  HGBUR MODERATE*  NEGATIVE  BILIRUBINUR NEGATIVE  NEGATIVE  KETONESUR NEGATIVE  NEGATIVE  PROTEINUR NEGATIVE  NEGATIVE  UROBILINOGEN 0.2  0.2  NITRITE NEGATIVE  NEGATIVE  LEUKOCYTESUR NEGATIVE  NEGATIVE      Imaging: Dg Chest Port 1 View  01/31/2014   CLINICAL DATA:  Status post HDcatheter placement.  EXAM: PORTABLE CHEST - 1 VIEW  COMPARISON:  DG CHEST PORTABLE dated 01/29/2014  FINDINGS: The lungs are adequately inflated. The hemidiaphragms are obscured. The cardiopericardial silhouette is enlarged. The pulmonary vascularity is engorged in the interstitial markings are increased. These findings are not greatly changed since the study of 28 April. The patient has undergone interval placement of a right  internal jugular venous catheter. The tip lies in the region of the junction of the right and left brachiocephalic veins. There is no evidence of a post procedure pneumothorax. A permanent pacemaker defibrillator is in place and appears unchanged.  IMPRESSION: 1. The venous catheter placed via the right internal jugular approach has its tip at the junction of the right and left brachiocephalic veins. There is no evidence of immediate post procedure complication. 2. The findings are consistent with congestive heart failure with pulmonary interstitial edema and bilateral pleural effusions.   Electronically Signed   By: David  [place]   On: 01/31/2014 20:50     Medications:   . sodium chloride Stopped (02/01/14 0800)  . heparin 1,350 Units/hr (02/01/14 0700)  . milrinone Stopped (02/01/14 0800)  . dialysis replacement fluid (prismasate) 500 mL/hr at 02/01/14 0840  . dialysis replacement fluid (prismasate) 500 mL/hr at 02/01/14 0840  . dialysate (PRISMASATE) 1,500 mL/hr at 02/01/14 0830   . albuterol  3 mL Inhalation QID  . aspirin  81 mg Oral Daily  . atorvastatin  80 mg Oral q1800  . budesonide-formoterol  2 puff Inhalation Daily  . hydrALAZINE  25 mg Oral 3 times per day  . insulin aspart  0-9 Units Subcutaneous TID WC  . insulin glargine  10 Units Subcutaneous QHS  . isosorbide dinitrate  10 mg Oral Q8H  . levothyroxine  100 mcg Oral QAC breakfast   albuterol, albuterol, heparin, HYDROcodone-acetaminophen, HYDROmorphone (DILAUDID) injection, ondansetron (ZOFRAN) IV, ondansetron, sodium chloride  Assessment/ Plan:  73M (Sees [facility] at [facility]) admitted with acute on chronic decompensated CHF with volume overload in setting of CKD4 (Last outpt SCr 2.31 12/2013), COPD, HTN, HLD, CAD, s/p BiV ICD, AFib, Anemia. Originally presented to [facility] and failed diuretic management there for volume issues with worsening renal function transferred to [facility]   1. CKD stage 5  Cardiorenal syndrome  will continue CVVHDF for volume removal 2. HTN   EF  10 %   It will be interesting to see if intermittent dialysis will help 3. Anemia  Will start aranesp   Patient with advanced renal failure and refractory to diuretics and systolic heart failure. Tolerating CVVHD   LOS: 3 Garnetta Buddy @TODAY @12 :00 PM

## 2014-02-02 DIAGNOSIS — I4891 Unspecified atrial fibrillation: Secondary | ICD-10-CM

## 2014-02-02 LAB — CBC
HCT: 27.8 % — ABNORMAL LOW (ref 39.0–52.0)
HEMOGLOBIN: 8.7 g/dL — AB (ref 13.0–17.0)
MCH: 28.9 pg (ref 26.0–34.0)
MCHC: 31.3 g/dL (ref 30.0–36.0)
MCV: 92.4 fL (ref 78.0–100.0)
PLATELETS: 185 10*3/uL (ref 150–400)
RBC: 3.01 MIL/uL — ABNORMAL LOW (ref 4.22–5.81)
RDW: 15 % (ref 11.5–15.5)
WBC: 5.4 10*3/uL (ref 4.0–10.5)

## 2014-02-02 LAB — RENAL FUNCTION PANEL
ALBUMIN: 3.1 g/dL — AB (ref 3.5–5.2)
Albumin: 3.2 g/dL — ABNORMAL LOW (ref 3.5–5.2)
BUN: 37 mg/dL — ABNORMAL HIGH (ref 6–23)
BUN: 30 mg/dL — AB (ref 6–23)
CALCIUM: 8.5 mg/dL (ref 8.4–10.5)
CO2: 28 meq/L (ref 19–32)
CO2: 28 meq/L (ref 19–32)
CREATININE: 1.49 mg/dL — AB (ref 0.50–1.35)
Calcium: 8.6 mg/dL (ref 8.4–10.5)
Chloride: 101 mEq/L (ref 96–112)
Chloride: 98 mEq/L (ref 96–112)
Creatinine, Ser: 1.39 mg/dL — ABNORMAL HIGH (ref 0.50–1.35)
GFR calc Af Amer: 56 mL/min — ABNORMAL LOW (ref >90)
GFR calc non Af Amer: 44 mL/min — ABNORMAL LOW (ref >90)
GFR calc non Af Amer: 48 mL/min — ABNORMAL LOW (ref >90)
GFR, EST AFRICAN AMERICAN: 52 mL/min — AB (ref >90)
GLUCOSE: 148 mg/dL — AB (ref 70–99)
Glucose, Bld: 75 mg/dL (ref 70–99)
Phosphorus: 2.8 mg/dL (ref 2.3–4.6)
Phosphorus: 2.2 mg/dL — ABNORMAL LOW (ref 2.3–4.6)
Potassium: 4.4 mEq/L (ref 3.7–5.3)
Potassium: 4.6 mEq/L (ref 3.7–5.3)
SODIUM: 140 meq/L (ref 137–147)
Sodium: 138 mEq/L (ref 137–147)

## 2014-02-02 LAB — GLUCOSE, CAPILLARY
GLUCOSE-CAPILLARY: 139 mg/dL — AB (ref 70–99)
GLUCOSE-CAPILLARY: 130 mg/dL — AB (ref 70–99)
Glucose-Capillary: 116 mg/dL — ABNORMAL HIGH (ref 70–99)
Glucose-Capillary: 111 mg/dL — ABNORMAL HIGH (ref 70–99)

## 2014-02-02 LAB — MAGNESIUM: Magnesium: 2.4 mg/dL (ref 1.5–2.5)

## 2014-02-02 LAB — HEPARIN LEVEL (UNFRACTIONATED): HEPARIN UNFRACTIONATED: 0.43 [IU]/mL (ref 0.30–0.70)

## 2014-02-02 NOTE — Progress Notes (Signed)
[facility] ROUNDING NOTE   Subjective:   Interval History: no complaints today tolerating ultrafiltration well  Objective:  Vital signs in last 24 hours:  Temp:  [96.7 F (35.9 C)-97.7 F (36.5 C)] 97.2 F (36.2 C) (05/02 0800) Pulse Rate:  [68-71] 70 (05/02 0900) Resp:  [18] 18 (05/01 1600) BP: (98-133)/(39-62) 118/39 mmHg (05/02 0900) SpO2:  [89 %-100 %] 96 % (05/02 0900) Weight:  [98.6 kg (217 lb 6 oz)] 98.6 kg (217 lb 6 oz) (05/02 0500)  Weight change: -4.1 kg (-9 lb 0.6 oz) Filed Weights   01/31/14 0300 02/01/14 0500 02/02/14 0500  Weight: 102.74 kg (226 lb 8 oz) 102.7 kg (226 lb 6.6 oz) 98.6 kg (217 lb 6 oz)    Intake/Output: I/O last 3 completed shifts: In: 2047.5 [P.O.:1180; I.V.:867.5] Out: 6881 [Urine:2100; Other:4781]   Intake/Output this shift:  Total I/O In: 14.5 [I.V.:14.5] Out: 222 [Other:222]  CVS- irregular  RS- CTA dull at bases  ABD- BS present soft non-distended  EXT- 2+ edema      Basic Metabolic Panel:  Recent Labs Lab 01/29/14 1910  01/31/14 0326 01/31/14 0847 02/01/14 0500 02/01/14 1915 02/02/14 0500  NA 132*  < > 134* 133* 136* 137 140  K 5.1  < > 3.8 4.1 4.1 4.2 4.4  CL 89*  < > 91* 90* 94* 96 101  CO2 23  < > 25 26 27 27 28   GLUCOSE 207*  < > 117* 89 120* 149* 75  BUN 97*  < > 97* 104* 76* 49* 37*  CREATININE 3.53*  < > 3.71* 3.74* 2.80* 1.86* 1.49*  CALCIUM 8.4  < > 8.3* 8.6 8.5 8.2* 8.5  MG 2.0  --   --   --  2.2  --  2.4  PHOS 5.5*  --   --   --  4.3 3.2 2.8  < > = values in this interval not displayed.  Liver Function Tests:  Recent Labs Lab 01/29/14 1910 01/31/14 0326 02/01/14 0500 02/01/14 1915 02/02/14 0500  AST 24 27  --   --   --   ALT 21 20  --   --   --   ALKPHOS 63 67  --   --   --   BILITOT <0.2* 0.2*  --   --   --   PROT 6.6 6.3  --   --   --   ALBUMIN 3.3* 3.2* 3.2* 3.1* 3.1*   No results found for this basename: LIPASE, AMYLASE,  in the last 168 hours No results found for this  basename: AMMONIA,  in the last 168 hours  CBC:  Recent Labs Lab 01/29/14 1910 01/30/14 0224 01/31/14 0326 02/01/14 0500 02/02/14 0500  WBC 6.9 8.2 6.4 5.7 5.4  HGB 10.3* 9.6* 9.3* 9.0* 8.7*  HCT 31.1* 29.2* 28.5* 27.4* 27.8*  MCV 89.1 89.3 89.6 89.0 92.4  PLT 212 205 180 181 185    Cardiac Enzymes:  Recent Labs Lab 01/29/14 1910  TROPONINI <0.30    BNP: No components found with this basename: POCBNP,   CBG:  Recent Labs Lab 02/01/14 0855 02/01/14 1222 02/01/14 1732 02/01/14 2201 02/02/14 0745  GLUCAP 147* 165* 172* 146* 116*    Microbiology: Results for orders placed during the hospital encounter of 01/29/14  URINE CULTURE     Status: None   Collection Time    01/30/14  2:44 AM      Result Value Ref Range Status   Specimen Description URINE,  RANDOM   Final   Special Requests NONE   Final   Culture  Setup Time     Final   Value: 01/30/2014 03:47     Performed at [facility]   Colony Count     Final   Value: 20,OOO COLONIES/ML     Performed at [facility]   Culture     Final   Value: Multiple bacterial morphotypes present, none predominant. Suggest appropriate recollection if clinically indicated.     Performed at [facility]   Report Status 01/31/2014 FINAL   Final    Coagulation Studies: No results found for this basename: LABPROT, INR,  in the last 72 hours  Urinalysis: No results found for this basename: COLORURINE, APPERANCEUR, LABSPEC, PHURINE, GLUCOSEU, HGBUR, BILIRUBINUR, KETONESUR, PROTEINUR, UROBILINOGEN, NITRITE, LEUKOCYTESUR,  in the last 72 hours    Imaging: Dg Chest Port 1 View  01/31/2014   CLINICAL DATA:  Status post HDcatheter placement.  EXAM: PORTABLE CHEST - 1 VIEW  COMPARISON:  DG CHEST PORTABLE dated 01/29/2014  FINDINGS: The lungs are adequately inflated. The hemidiaphragms are obscured. The cardiopericardial silhouette is enlarged. The pulmonary vascularity is engorged in the interstitial markings  are increased. These findings are not greatly changed since the study of 28 April. The patient has undergone interval placement of a right internal jugular venous catheter. The tip lies in the region of the junction of the right and left brachiocephalic veins. There is no evidence of a post procedure pneumothorax. A permanent pacemaker defibrillator is in place and appears unchanged.  IMPRESSION: 1. The venous catheter placed via the right internal jugular approach has its tip at the junction of the right and left brachiocephalic veins. There is no evidence of immediate post procedure complication. 2. The findings are consistent with congestive heart failure with pulmonary interstitial edema and bilateral pleural effusions.   Electronically Signed   By: David  [place]   On: 01/31/2014 20:50     Medications:   . sodium chloride Stopped (02/01/14 0800)  . heparin 1,450 Units/hr (02/02/14 0856)  . milrinone Stopped (02/01/14 0800)  . dialysis replacement fluid (prismasate) 500 mL/hr at 02/02/14 0513  . dialysis replacement fluid (prismasate) 500 mL/hr at 02/02/14 0513  . dialysate (PRISMASATE) 1,500 mL/hr at 02/02/14 0831   . albuterol  3 mL Inhalation QID  . aspirin  81 mg Oral Daily  . atorvastatin  80 mg Oral q1800  . budesonide-formoterol  2 puff Inhalation Daily  . [START ON 02/08/2014] darbepoetin (ARANESP) injection - DIALYSIS  100 mcg Subcutaneous Q Fri-HD  . docusate sodium  100 mg Oral Daily  . hydrALAZINE  25 mg Oral 3 times per day  . insulin aspart  0-9 Units Subcutaneous TID WC  . insulin glargine  10 Units Subcutaneous QHS  . isosorbide dinitrate  10 mg Oral Q8H  . levothyroxine  100 mcg Oral QAC breakfast   albuterol, albuterol, heparin, HYDROcodone-acetaminophen, HYDROmorphone (DILAUDID) injection, ondansetron (ZOFRAN) IV, ondansetron, sodium chloride  Assessment/ Plan:  49[facility] admitted with acute on chronic decompensated CHF with volume overload in setting of  CKD4 (Last outpt SCr 2.31 12/2013), COPD, HTN, HLD, CAD, s/p BiV ICD, AFib, Anemia. [facility] and failed diuretic management there for volume issues with worsening renal function [facility]   1. CKD stage 5 Cardiorenal syndrome will continue CVVHDF for volume removal -  weight down 2. HTN EF 10 % It will be interesting to  see if intermittent dialysis will be tolerated 3. Anemia started aranesp  Patient with advanced renal failure and refractory to diuretics and systolic heart failure. Tolerating CVVHD     LOS: 4 Garnetta Buddy @TODAY @9 :55 AM

## 2014-02-02 NOTE — Progress Notes (Signed)
Subjective:  Undergoing CVVHD; breathing better  Objective:   Vital Signs in the last 24 hours: Temp:  [96.7 F (35.9 C)-97.7 F (36.5 C)] 97.2 F (36.2 C) (05/02 0800) Pulse Rate:  [68-71] 68 (05/02 1000) Resp:  [18] 18 (05/01 1600) BP: (98-129)/(39-62) 116/45 mmHg (05/02 1000) SpO2:  [89 %-100 %] 98 % (05/02 1000) Weight:  [217 lb 6 oz (98.6 kg)] 217 lb 6 oz (98.6 kg) (05/02 0500)  Intake/Output from previous day: 05/01 0701 - 05/02 0700 In: 1433.6 [P.O.:1060; I.V.:373.6] Out: 4856 [Urine:925]  Medications: . albuterol  3 mL Inhalation QID  . aspirin  81 mg Oral Daily  . atorvastatin  80 mg Oral q1800  . budesonide-formoterol  2 puff Inhalation Daily  . [START ON 02/08/2014] darbepoetin (ARANESP) injection - DIALYSIS  100 mcg Subcutaneous Q Fri-HD  . docusate sodium  100 mg Oral Daily  . hydrALAZINE  25 mg Oral 3 times per day  . insulin aspart  0-9 Units Subcutaneous TID WC  . insulin glargine  10 Units Subcutaneous QHS  . isosorbide dinitrate  10 mg Oral Q8H  . levothyroxine  100 mcg Oral QAC breakfast    . sodium chloride Stopped (02/01/14 0800)  . heparin 1,450 Units/hr (02/02/14 0856)  . milrinone Stopped (02/01/14 0800)  . dialysis replacement fluid (prismasate) 500 mL/hr at 02/02/14 0513  . dialysis replacement fluid (prismasate) 500 mL/hr at 02/02/14 0513  . dialysate (PRISMASATE) 1,500 mL/hr at 02/02/14 0831    Physical Exam:   General appearance: alert, cooperative and no distress Neck: no carotid bruit, no JVD, supple, symmetrical, trachea midline and thyroid not enlarged, symmetric, no tenderness/mass/nodules Lungs: decreased BS; no wheeezing Heart: regular rate and rhythm and 1/6 sem Abdomen: soft, non-tender; bowel sounds normal; no masses,  no organomegaly Extremities: 1-2+ LE edema Skin: Skin color, texture, turgor normal. No rashes or lesions Neurologic: Grossly normal   Rate: 70  Rhythm: V paced  Lab Results:   Recent Labs   02/01/14 1915 02/02/14 0500  NA 137 140  K 4.2 4.4  CL 96 101  CO2 27 28  GLUCOSE 149* 75  BUN 49* 37*  CREATININE 1.86* 1.49*   CBC    Component Value Date/Time   WBC 5.4 02/02/2014 0500   RBC 3.01* 02/02/2014 0500   HGB 8.7* 02/02/2014 0500   HCT 27.8* 02/02/2014 0500   PLT 185 02/02/2014 0500   MCV 92.4 02/02/2014 0500   MCH 28.9 02/02/2014 0500   MCHC 31.3 02/02/2014 0500   RDW 15.0 02/02/2014 0500    No results found for this basename: TROPONINI, CK, MB,  in the last 72 hours  Hepatic Function Panel  Recent Labs  01/31/14 0326  02/02/14 0500  PROT 6.3  --   --   ALBUMIN 3.2*  < > 3.1*  AST 27  --   --   ALT 20  --   --   ALKPHOS 67  --   --   BILITOT 0.2*  --   --   < > = values in this interval not displayed. No results found for this basename: INR,  in the last 72 hours BNP (last 3 results)  Recent Labs  01/29/14 1910  PROBNP 24228.0*    Lipid Panel  No results found for this basename: chol,  trig,  hdl,  cholhdl,  vldl,  ldlcalc      Imaging:  Dg Chest Port 1 View  01/31/2014   CLINICAL DATA:  Status post  HDcatheter placement.  EXAM: PORTABLE CHEST - 1 VIEW  COMPARISON:  DG CHEST PORTABLE dated 01/29/2014  FINDINGS: The lungs are adequately inflated. The hemidiaphragms are obscured. The cardiopericardial silhouette is enlarged. The pulmonary vascularity is engorged in the interstitial markings are increased. These findings are not greatly changed since the study of 28 April. The patient has undergone interval placement of a right internal jugular venous catheter. The tip lies in the region of the junction of the right and left brachiocephalic veins. There is no evidence of a post procedure pneumothorax. A permanent pacemaker defibrillator is in place and appears unchanged.  IMPRESSION: 1. The venous catheter placed via the right internal jugular approach has its tip at the junction of the right and left brachiocephalic veins. There is no evidence of immediate post  procedure complication. 2. The findings are consistent with congestive heart failure with pulmonary interstitial edema and bilateral pleural effusions.   Electronically Signed   By: David  [place]   On: 01/31/2014 20:50      Assessment/Plan:   Principal Problem:   Acute respiratory failure with hypoxia Active Problems:   Shortness of breath   CHF (congestive heart failure), NYHA class IV   Acute on chronic renal failure   Hyperkalemia   Anemia of chronic disease   HTN (hypertension)   Unspecified hypothyroidism   Hyponatremia   CAD (coronary artery disease) of artery bypass graft   Started on CVVHD yesterday. I/O -3422 and weight down from 226 to 217 since yesterday; currently -3569 and down 12 lbs since admission.  On IV heparin with underlying AF and paced rhythm. EF 15-20%; CRT-D device. No chest pain. For AV fistula on Monday. Mild oozing at hemodialysis catheter site. Cr 1.49 today. Add back very low dose carvedilol once more stable.   Lennette Bihari, MD, [facility] 02/02/2014, 11:18 AM

## 2014-02-02 NOTE — Consult Note (Signed)
[facility]  Subjective  - Feels ok   Objective 116/45 68 97.2 F (36.2 C) (Oral) 18 98%  Intake/Output Summary (Last 24 hours) at 02/02/14 1113 Last data filed at 02/02/14 1100  Gross per 24 hour  Intake 990.33 ml  Output   4772 ml  Net -3781.67 ml   Some dried blood ooze around right neck cath  Data: Vein map reviewed,  So so cephalic vein at wrist 2-4 mm good upper arm basilic vein 3-5 mm  Assessment/Planning: Should be candidate for left radial cephalic AVF.  Will place this and catheter on Monday.  Please stop heparin on call to OR Monday.  Risk benefits possible complications discussed with daughters and pt.  Including but not limited to bleeding infection non maturation of fistula.  Sherren Kerns 02/02/2014 11:13 AM --  Laboratory Lab Results:  Recent Labs  02/01/14 0500 02/02/14 0500  WBC 5.7 5.4  HGB 9.0* 8.7*  HCT 27.4* 27.8*  PLT 181 185   BMET  Recent Labs  02/01/14 1915 02/02/14 0500  NA 137 140  K 4.2 4.4  CL 96 101  CO2 27 28  GLUCOSE 149* 75  BUN 49* 37*  CREATININE 1.86* 1.49*  CALCIUM 8.2* 8.5    COAG No results found for this basename: INR, PROTIME   No results found for this basename: PTT

## 2014-02-02 NOTE — Progress Notes (Deleted)
Subjective: Interval History: has complaints hip sore, needs pain meds.  Objective: Vital signs in last 24 hours: Temp:  [96.7 F (35.9 C)-97.7 F (36.5 C)] 97.2 F (36.2 C) (05/02 0800) Pulse Rate:  [68-71] 70 (05/02 0800) Resp:  [18] 18 (05/01 1600) BP: (98-133)/(40-62) 112/49 mmHg (05/02 0800) SpO2:  [89 %-100 %] 89 % (05/02 0800) Weight:  [98.6 kg (217 lb 6 oz)] 98.6 kg (217 lb 6 oz) (05/02 0500) Weight change: -4.1 kg (-9 lb 0.6 oz)  Intake/Output from previous day: 05/01 0701 - 05/02 0700 In: 1433.6 [P.O.:1060; I.V.:373.6] Out: 4856 [Urine:925] Intake/Output this shift: Total I/O In: 14.5 [I.V.:14.5] Out: 148 [Other:148]  General appearance: cooperative, moderate distress and pale Resp: clear to auscultation bilaterally Cardio: S1, S2 normal and systolic murmur: holosystolic 2/6, blowing at apex GI: soft, liver down 4 cm,  Extremities: AVF LUA,  Skin doughy  Lab Results:  Recent Labs  02/01/14 0500 02/02/14 0500  WBC 5.7 5.4  HGB 9.0* 8.7*  HCT 27.4* 27.8*  PLT 181 185   BMET:  Recent Labs  02/01/14 1915 02/02/14 0500  NA 137 140  K 4.2 4.4  CL 96 101  CO2 27 28  GLUCOSE 149* 75  BUN 49* 37*  CREATININE 1.86* 1.49*  CALCIUM 8.2* 8.5   No results found for this basename: PTH,  in the last 72 hours Iron Studies:  Recent Labs  02/01/14 0500 02/01/14 1400  IRON 51 56  TIBC 383 382  FERRITIN 29  --     Studies/Results: Dg Chest Port 1 View  01/31/2014   CLINICAL DATA:  Status post HDcatheter placement.  EXAM: PORTABLE CHEST - 1 VIEW  COMPARISON:  DG CHEST PORTABLE dated 01/29/2014  FINDINGS: The lungs are adequately inflated. The hemidiaphragms are obscured. The cardiopericardial silhouette is enlarged. The pulmonary vascularity is engorged in the interstitial markings are increased. These findings are not greatly changed since the study of 28 April. The patient has undergone interval placement of a right internal jugular venous catheter. The tip  lies in the region of the junction of the right and left brachiocephalic veins. There is no evidence of a post procedure pneumothorax. A permanent pacemaker defibrillator is in place and appears unchanged.  IMPRESSION: 1. The venous catheter placed via the right internal jugular approach has its tip at the junction of the right and left brachiocephalic veins. There is no evidence of immediate post procedure complication. 2. The findings are consistent with congestive heart failure with pulmonary interstitial edema and bilateral pleural effusions.   Electronically Signed   By: David  [place]   On: 01/31/2014 20:50    I have reviewed the patient's current medications.  Assessment/Plan: 1 ESRD for HD, some xs vol . Usual MWF get on sched on Mon 2 S/p hip for AVN per Dr. Charlann Boxer 3 Anemia blood loss ? Mild dilution 4 HPTH meds 5 Wegners 6 Nonadherence 7 Tracheal stenosis P HD, epo, follow Hb, pain control, counsel    LOS: 4 days   Nahshon Reich L Jerrie Gullo 02/02/2014,8:59 AM

## 2014-02-02 NOTE — Progress Notes (Signed)
ANTICOAGULATION CONSULT NOTE - Follow Up Consult  Pharmacy Consult  :  Heparin Indication  :  atrial fibrillation   Heparin Dosing Weight: 88 kg    Recent Labs  01/31/14 0326 01/31/14 0847 02/01/14 0500 02/01/14 1430 02/01/14 1915 02/02/14 0500  HGB 9.3*  --  9.0*  --   --  8.7*  HCT 28.5*  --  27.4*  --   --  27.8*  PLT 180  --  181  --   --  185  APTT  --  40*  --   --   --   --   HEPARINUNFRC  --  0.51 0.22* 0.32  --  0.43  CREATININE 3.71* 3.74* 2.80*  --  1.86* 1.49*    Medications: Scheduled:  . albuterol  3 mL Inhalation QID  . aspirin  81 mg Oral Daily  . atorvastatin  80 mg Oral q1800  . budesonide-formoterol  2 puff Inhalation Daily  . [START ON 02/08/2014] darbepoetin (ARANESP) injection - DIALYSIS  100 mcg Subcutaneous Q Fri-HD  . docusate sodium  100 mg Oral Daily  . hydrALAZINE  25 mg Oral 3 times per day  . insulin aspart  0-9 Units Subcutaneous TID WC  . insulin glargine  10 Units Subcutaneous QHS  . isosorbide dinitrate  10 mg Oral Q8H  . levothyroxine  100 mcg Oral QAC breakfast    Infusions:  . sodium chloride Stopped (02/01/14 0800)  . heparin 1,450 Units/hr (02/02/14 0856)  . milrinone Stopped (02/01/14 0800)  . dialysis replacement fluid (prismasate) 500 mL/hr at 02/02/14 0513  . dialysis replacement fluid (prismasate) 500 mL/hr at 02/02/14 0513  . dialysate (PRISMASATE) 1,500 mL/hr at 02/02/14 0831    Assessment:  73 y/o male on Heparin bridging for atrial fibrillation while Coumadin on hold for possible surgical procedure.  Heparin rate 1450 units/hr.  Heparin level 0.43 units/ml. No evidence of bleeding complications noted   Goal: Heparin Level  >  0.3 - 0.7 units/ml   Plan: 1. Continue Heparin infusion at 1450 units/hr. 2. Daily Heparin level and CBC while on Heparin. Follow Platelet counts.   Oza Oberle, Elisha Headland,  Pharm.D  02/02/2014, 10:09 AM

## 2014-02-03 ENCOUNTER — Inpatient Hospital Stay (HOSPITAL_COMMUNITY): Payer: Medicare PPO

## 2014-02-03 LAB — RENAL FUNCTION PANEL
ALBUMIN: 3.1 g/dL — AB (ref 3.5–5.2)
BUN: 23 mg/dL (ref 6–23)
CHLORIDE: 100 meq/L (ref 96–112)
CO2: 27 mEq/L (ref 19–32)
CREATININE: 1.23 mg/dL (ref 0.50–1.35)
Calcium: 8.7 mg/dL (ref 8.4–10.5)
GFR, EST AFRICAN AMERICAN: 65 mL/min — AB (ref >90)
GFR, EST NON AFRICAN AMERICAN: 56 mL/min — AB (ref >90)
Glucose, Bld: 97 mg/dL (ref 70–99)
POTASSIUM: 4.7 meq/L (ref 3.7–5.3)
Phosphorus: 2.2 mg/dL — ABNORMAL LOW (ref 2.3–4.6)
SODIUM: 139 meq/L (ref 137–147)

## 2014-02-03 LAB — MAGNESIUM: MAGNESIUM: 2.5 mg/dL (ref 1.5–2.5)

## 2014-02-03 LAB — CBC
HCT: 29 % — ABNORMAL LOW (ref 39.0–52.0)
HEMOGLOBIN: 9.1 g/dL — AB (ref 13.0–17.0)
MCH: 29.2 pg (ref 26.0–34.0)
MCHC: 31.4 g/dL (ref 30.0–36.0)
MCV: 92.9 fL (ref 78.0–100.0)
Platelets: 181 10*3/uL (ref 150–400)
RBC: 3.12 MIL/uL — AB (ref 4.22–5.81)
RDW: 15.5 % (ref 11.5–15.5)
WBC: 6.7 10*3/uL (ref 4.0–10.5)

## 2014-02-03 LAB — GLUCOSE, CAPILLARY
GLUCOSE-CAPILLARY: 132 mg/dL — AB (ref 70–99)
Glucose-Capillary: 135 mg/dL — ABNORMAL HIGH (ref 70–99)
Glucose-Capillary: 150 mg/dL — ABNORMAL HIGH (ref 70–99)
Glucose-Capillary: 116 mg/dL — ABNORMAL HIGH (ref 70–99)

## 2014-02-03 LAB — HEPARIN LEVEL (UNFRACTIONATED): HEPARIN UNFRACTIONATED: 0.45 [IU]/mL (ref 0.30–0.70)

## 2014-02-03 MED ORDER — ALPRAZOLAM 0.25 MG PO TABS
0.2500 mg | ORAL_TABLET | Freq: Two times a day (BID) | ORAL | Status: DC | PRN
  Administered 2014-02-03 – 2014-02-11 (×8): 0.25 mg via ORAL
  Filled 2014-02-03 (×7): qty 1

## 2014-02-03 MED ORDER — FUROSEMIDE 10 MG/ML IJ SOLN
120.0000 mg | Freq: Four times a day (QID) | INTRAVENOUS | Status: DC
  Administered 2014-02-03 – 2014-02-05 (×6): 120 mg via INTRAVENOUS
  Filled 2014-02-03 (×10): qty 12

## 2014-02-03 MED ORDER — DEXTROSE 5 % IV SOLN
1.5000 g | INTRAVENOUS | Status: AC
  Administered 2014-02-04: 1.5 g via INTRAVENOUS
  Filled 2014-02-03 (×2): qty 1.5

## 2014-02-03 NOTE — Progress Notes (Signed)
ANTICOAGULATION CONSULT NOTE - Follow Up Consult  Pharmacy Consult  :  Heparin Indication  :  atrial fibrillation   Heparin Dosing Weight: 88 kg   Recent Labs  01/31/14 0847 02/01/14 0500 02/01/14 1430 02/01/14 1915 02/02/14 0500 02/02/14 1350 02/03/14 0528  HGB  --  9.0*  --   --  8.7*  --  9.1*  HCT  --  27.4*  --   --  27.8*  --  29.0*  PLT  --  181  --   --  185  --  181  APTT 40*  --   --   --   --   --   --   HEPARINUNFRC 0.51 0.22* 0.32  --  0.43  --  0.45  CREATININE 3.74* 2.80*  --  1.86* 1.49* 1.39* 1.23     Medications: Scheduled:  . albuterol  3 mL Inhalation QID  . aspirin  81 mg Oral Daily  . atorvastatin  80 mg Oral q1800  . budesonide-formoterol  2 puff Inhalation Daily  . [START ON 02/04/2014] cefUROXime (ZINACEF)  IV  1.5 g Intravenous 60 min Pre-Op  . [START ON 02/08/2014] darbepoetin (ARANESP) injection - DIALYSIS  100 mcg Subcutaneous Q Fri-HD  . docusate sodium  100 mg Oral Daily  . hydrALAZINE  25 mg Oral 3 times per day  . insulin aspart  0-9 Units Subcutaneous TID WC  . insulin glargine  10 Units Subcutaneous QHS  . isosorbide dinitrate  10 mg Oral Q8H  . levothyroxine  100 mcg Oral QAC breakfast    Infusions:  . sodium chloride Stopped (02/01/14 0800)  . heparin 1,450 Units/hr (02/03/14 0218)  . milrinone Stopped (02/01/14 0800)  . dialysis replacement fluid (prismasate) 500 mL/hr at 02/03/14 0130  . dialysis replacement fluid (prismasate) 500 mL/hr at 02/03/14 0159  . dialysate (PRISMASATE) 1,500 mL/hr at 02/03/14 0544    Assessment:  73 y/o male on Heparin bridging for atrial fibrillation while oral anticoagulation held for AVF placement Monday.  Heparin rate 1450 units/ml.  Heparin level 0.45 units/ml.  No evidence of bleeding complications noted   Goal:  Heparin Level  >  0.3 - 0.7 units/ml   Plan: 1. Continue Heparin infusion at 1450 units/hr. 2. Daily Heparin Levels, Platelet counts,  CBC.  Monitor for bleeding complications     Laurena Bering,  Pharm.D  02/03/2014, 8:38 AM

## 2014-02-03 NOTE — Progress Notes (Addendum)
Subjective:  Undergoing CVVHD; Had some sob during night which improved with breathing Rx.  Objective:   Vital Signs in the last 24 hours: Temp:  [96.4 F (35.8 C)-98.2 F (36.8 C)] 96.4 F (35.8 C) (05/03 0800) Pulse Rate:  [68-71] 70 (05/03 0700) Resp:  [20-22] 20 (05/03 0400) BP: (104-133)/(39-73) 109/55 mmHg (05/03 0700) SpO2:  [90 %-98 %] 96 % (05/03 0716) Weight:  [216 lb 0.8 oz (98 kg)] 216 lb 0.8 oz (98 kg) (05/03 0500)  Intake/Output from previous day: 05/02 0701 - 05/03 0700 In: 978 [P.O.:630; I.V.:348] Out: 4294 [Urine:288; Stool:2]  Medications: . albuterol  3 mL Inhalation QID  . aspirin  81 mg Oral Daily  . atorvastatin  80 mg Oral q1800  . budesonide-formoterol  2 puff Inhalation Daily  . [START ON 02/04/2014] cefUROXime (ZINACEF)  IV  1.5 g Intravenous 60 min Pre-Op  . [START ON 02/08/2014] darbepoetin (ARANESP) injection - DIALYSIS  100 mcg Subcutaneous Q Fri-HD  . docusate sodium  100 mg Oral Daily  . hydrALAZINE  25 mg Oral 3 times per day  . insulin aspart  0-9 Units Subcutaneous TID WC  . insulin glargine  10 Units Subcutaneous QHS  . isosorbide dinitrate  10 mg Oral Q8H  . levothyroxine  100 mcg Oral QAC breakfast    . sodium chloride Stopped (02/01/14 0800)  . heparin 1,450 Units/hr (02/03/14 0218)  . milrinone Stopped (02/01/14 0800)  . dialysis replacement fluid (prismasate) 500 mL/hr at 02/03/14 0130  . dialysis replacement fluid (prismasate) 500 mL/hr at 02/03/14 0159  . dialysate (PRISMASATE) 1,500 mL/hr at 02/03/14 0544    Physical Exam:   General appearance: alert, cooperative and no distress Neck: no carotid bruit, no JVD, supple, symmetrical, trachea midline and thyroid not enlarged, symmetric, no tenderness/mass/nodules Lungs: decreased BS; no wheeezing Heart: regular rate and rhythm and 1/6 sem Abdomen: soft, non-tender; bowel sounds normal; no masses,  no organomegaly Extremities:  LE edema improved; R>L Skin: Skin color,  texture, turgor normal. No rashes or lesions Neurologic: Grossly normal   Rate: 70  Rhythm: V paced  Lab Results:   Recent Labs  02/02/14 1350 02/03/14 0528  NA 138 139  K 4.6 4.7  CL 98 100  CO2 28 27  GLUCOSE 148* 97  BUN 30* 23  CREATININE 1.39* 1.23   CBC    Component Value Date/Time   WBC 6.7 02/03/2014 0528   RBC 3.12* 02/03/2014 0528   HGB 9.1* 02/03/2014 0528   HCT 29.0* 02/03/2014 0528   PLT 181 02/03/2014 0528   MCV 92.9 02/03/2014 0528   MCH 29.2 02/03/2014 0528   MCHC 31.4 02/03/2014 0528   RDW 15.5 02/03/2014 0528    No results found for this basename: TROPONINI, CK, MB,  in the last 72 hours  Hepatic Function Panel  Recent Labs  02/03/14 0528  ALBUMIN 3.1*   No results found for this basename: INR,  in the last 72 hours BNP (last 3 results)  Recent Labs  01/29/14 1910  PROBNP 24228.0*    Lipid Panel  No results found for this basename: chol,  trig,  hdl,  cholhdl,  vldl,  ldlcalc      Imaging:  No results found.    Assessment/Plan:   Principal Problem:   Acute respiratory failure with hypoxia Active Problems:   Shortness of breath   CHF (congestive heart failure), NYHA class IV   Acute on chronic renal failure   Hyperkalemia  Anemia of chronic disease   HTN (hypertension)   Unspecified hypothyroidism   Hyponatremia   CAD (coronary artery disease) of artery bypass graft   AF (atrial fibrillation)   Started on CVVHD 2 days ago . I/O -3316 yesterday, and -6103 since admission.  Weight down an additional lb since yesterday since yesterday and 13 lbs since admission.  On IV heparin with underlying AF and paced rhythm. EF 15-20%; CRT-D device. No chest pain. For AV fistula tomorrow with plan to hold heparin on call.  Oozing at hemodialysis catheter site improved from yesterday. Cr 1.23  today. Will ultimately need resumption of very dose bb , but with wheezing history may need cardioselective rather than coreg which is nonselective with beta 1,  2, and alpha blockade.   [name] [name] A. Francile Woolford, MD, [facility] 02/03/2014, 8:40 AM

## 2014-02-03 NOTE — Progress Notes (Signed)
[facility] ROUNDING NOTE   Subjective:   Interval History: doing better weight down about 10 lbs and breathing improved    Objective:  Vital signs in last 24 hours:  Temp:  [96.4 F (35.8 C)-98.2 F (36.8 C)] 96.4 F (35.8 C) (05/03 0800) Pulse Rate:  [69-71] 70 (05/03 1000) Resp:  [20-22] 20 (05/03 0400) BP: (104-133)/(21-70) 115/21 mmHg (05/03 1000) SpO2:  [90 %-100 %] 100 % (05/03 1146) Weight:  [98 kg (216 lb 0.8 oz)] 98 kg (216 lb 0.8 oz) (05/03 0500)  Weight change: -0.6 kg (-1 lb 5.2 oz) Filed Weights   02/01/14 0500 02/02/14 0500 02/03/14 0500  Weight: 102.7 kg (226 lb 6.6 oz) 98.6 kg (217 lb 6 oz) 98 kg (216 lb 0.8 oz)    Intake/Output: I/O last 3 completed shifts: In: 1332 [P.O.:810; I.V.:522] Out: 6213 [Urine:583; KGMWN:0272; Stool:2]   Intake/Output this shift:  Total I/O In: 538 [P.O.:480; I.V.:58] Out: 167 [Other:167]  CVS- RRR RS- CTA ABD- BS present soft non-distended EXT- edema improved   Basic Metabolic Panel:  Recent Labs Lab 01/29/14 1910  02/01/14 0500 02/01/14 1915 02/02/14 0500 02/02/14 1350 02/03/14 0528  NA 132*  < > 136* 137 140 138 139  K 5.1  < > 4.1 4.2 4.4 4.6 4.7  CL 89*  < > 94* 96 101 98 100  CO2 23  < > 27 27 28 28 27   GLUCOSE 207*  < > 120* 149* 75 148* 97  BUN 97*  < > 76* 49* 37* 30* 23  CREATININE 3.53*  < > 2.80* 1.86* 1.49* 1.39* 1.23  CALCIUM 8.4  < > 8.5 8.2* 8.5 8.6 8.7  MG 2.0  --  2.2  --  2.4  --  2.5  PHOS 5.5*  --  4.3 3.2 2.8 2.2* 2.2*  < > = values in this interval not displayed.  Liver Function Tests:  Recent Labs Lab 01/29/14 1910 01/31/14 0326 02/01/14 0500 02/01/14 1915 02/02/14 0500 02/02/14 1350 02/03/14 0528  AST 24 27  --   --   --   --   --   ALT 21 20  --   --   --   --   --   ALKPHOS 63 67  --   --   --   --   --   BILITOT <0.2* 0.2*  --   --   --   --   --   PROT 6.6 6.3  --   --   --   --   --   ALBUMIN 3.3* 3.2* 3.2* 3.1* 3.1* 3.2* 3.1*   No results found  for this basename: LIPASE, AMYLASE,  in the last 168 hours No results found for this basename: AMMONIA,  in the last 168 hours  CBC:  Recent Labs Lab 01/30/14 0224 01/31/14 0326 02/01/14 0500 02/02/14 0500 02/03/14 0528  WBC 8.2 6.4 5.7 5.4 6.7  HGB 9.6* 9.3* 9.0* 8.7* 9.1*  HCT 29.2* 28.5* 27.4* 27.8* 29.0*  MCV 89.3 89.6 89.0 92.4 92.9  PLT 205 180 181 185 181    Cardiac Enzymes:  Recent Labs Lab 01/29/14 1910  TROPONINI <0.30    BNP: No components found with this basename: POCBNP,   CBG:  Recent Labs Lab 02/02/14 0745 02/02/14 1137 02/02/14 1629 02/02/14 2143 02/03/14 0730  GLUCAP 116* 139* 130* 111* 132*    Microbiology: Results for orders placed during the hospital encounter of 01/29/14  URINE CULTURE  Status: None   Collection Time    01/30/14  2:44 AM      Result Value Ref Range Status   Specimen Description URINE, RANDOM   Final   Special Requests NONE   Final   Culture  Setup Time     Final   Value: 01/30/2014 03:47     Performed at [facility]   Colony Count     Final   Value: 20,OOO COLONIES/ML     Performed at [facility]   Culture     Final   Value: Multiple bacterial morphotypes present, none predominant. Suggest appropriate recollection if clinically indicated.     Performed at [facility]   Report Status 01/31/2014 FINAL   Final    Coagulation Studies: No results found for this basename: LABPROT, INR,  in the last 72 hours  Urinalysis: No results found for this basename: COLORURINE, APPERANCEUR, LABSPEC, PHURINE, GLUCOSEU, HGBUR, BILIRUBINUR, KETONESUR, PROTEINUR, UROBILINOGEN, NITRITE, LEUKOCYTESUR,  in the last 72 hours    Imaging: [facility]  02/03/2014   CLINICAL DATA:  Acute renal insufficiency.  Evaluate for obstruction  EXAM: RENAL/URINARY TRACT ULTRASOUND COMPLETE  COMPARISON:  11/15/2013  FINDINGS: Right Kidney:  Length: 11.4 cm. Echogenicity within normal limits. No mass or hydronephrosis  visualized.  Left Kidney:  Length: 12.4 cm. Echogenicity within normal limits. No mass or hydronephrosis visualized.  Bladder:  Collapsed around a Foley catheter.  Other:  Left pleural effusion and mild ascites identified  IMPRESSION: 1. No obstructive uropathy.   Electronically Signed   By: Signa Kellaylor  Stroud M.D.   On: 02/03/2014 11:31     Medications:   . sodium chloride Stopped (02/01/14 0800)  . heparin 1,450 Units/hr (02/03/14 0218)  . milrinone Stopped (02/01/14 0800)   . albuterol  3 mL Inhalation QID  . aspirin  81 mg Oral Daily  . atorvastatin  80 mg Oral q1800  . budesonide-formoterol  2 puff Inhalation Daily  . [START ON 02/04/2014] cefUROXime (ZINACEF)  IV  1.5 g Intravenous 60 min Pre-Op  . [START ON 02/08/2014] darbepoetin (ARANESP) injection - DIALYSIS  100 mcg Subcutaneous Q Fri-HD  . docusate sodium  100 mg Oral Daily  . hydrALAZINE  25 mg Oral 3 times per day  . insulin aspart  0-9 Units Subcutaneous TID WC  . insulin glargine  10 Units Subcutaneous QHS  . isosorbide dinitrate  10 mg Oral Q8H  . levothyroxine  100 mcg Oral QAC breakfast   albuterol, albuterol, HYDROcodone-acetaminophen, HYDROmorphone (DILAUDID) injection, ondansetron (ZOFRAN) IV, ondansetron  Assessment/ Plan:  58M ([facility] admitted with acute on chronic decompensated CHF with volume overload in setting of CKD4 (Last outpt SCr 2.31 12/2013), COPD, HTN, HLD, CAD, s/p BiV ICD, AFib, Anemia. [facility] and failed diuretic management there for volume issues with worsening renal function [facility]   1. CKD stage 5 Cardiorenal syndrome will continue CVVHDF for volume removal - weight down  2. HTN EF 10 % It will be interesting to see if intermittent dialysis will be tolerated  3. Anemia started aranesp  4. Will stop CVVHD today and place on diuretics     Patient with advanced renal failure and refractory to diuretics and systolic heart failure. Started CVVHD   We shall see if he is more responsive to diuretics.   He is scheduled for permcath and fistula next week     LOS: 5 Garnetta BuddyMartin W Tashayla Therien @TODAY @12 :02 PM

## 2014-02-04 ENCOUNTER — Encounter (HOSPITAL_COMMUNITY): Payer: Medicare PPO | Admitting: Anesthesiology

## 2014-02-04 ENCOUNTER — Encounter (HOSPITAL_COMMUNITY): Payer: Self-pay | Admitting: Anesthesiology

## 2014-02-04 ENCOUNTER — Encounter (HOSPITAL_COMMUNITY)
Admission: AD | Disposition: A | Discharge status: Home / Self Care | Payer: Self-pay | Source: Other Acute Inpatient Hospital | Attending: Cardiology

## 2014-02-04 ENCOUNTER — Inpatient Hospital Stay (HOSPITAL_COMMUNITY): Payer: Medicare PPO

## 2014-02-04 ENCOUNTER — Inpatient Hospital Stay (HOSPITAL_COMMUNITY): Payer: Medicare PPO | Admitting: Anesthesiology

## 2014-02-04 ENCOUNTER — Other Ambulatory Visit: Payer: Self-pay | Admitting: *Deleted

## 2014-02-04 DIAGNOSIS — N186 End stage renal disease: Secondary | ICD-10-CM

## 2014-02-04 DIAGNOSIS — Z95810 Presence of automatic (implantable) cardiac defibrillator: Secondary | ICD-10-CM | POA: Diagnosis present

## 2014-02-04 DIAGNOSIS — Z4931 Encounter for adequacy testing for hemodialysis: Secondary | ICD-10-CM

## 2014-02-04 DIAGNOSIS — I4891 Unspecified atrial fibrillation: Secondary | ICD-10-CM

## 2014-02-04 HISTORY — PX: AV FISTULA PLACEMENT: SHX1204

## 2014-02-04 HISTORY — PX: INSERTION OF DIALYSIS CATHETER: SHX1324

## 2014-02-04 LAB — CBC
HCT: 28.4 % — ABNORMAL LOW (ref 39.0–52.0)
Hemoglobin: 8.9 g/dL — ABNORMAL LOW (ref 13.0–17.0)
MCH: 29.7 pg (ref 26.0–34.0)
MCHC: 31.3 g/dL (ref 30.0–36.0)
MCV: 94.7 fL (ref 78.0–100.0)
PLATELETS: 170 10*3/uL (ref 150–400)
RBC: 3 MIL/uL — ABNORMAL LOW (ref 4.22–5.81)
RDW: 15.7 % — ABNORMAL HIGH (ref 11.5–15.5)
WBC: 7.5 10*3/uL (ref 4.0–10.5)

## 2014-02-04 LAB — BASIC METABOLIC PANEL
BUN: 36 mg/dL — AB (ref 6–23)
CO2: 29 meq/L (ref 19–32)
CREATININE: 1.9 mg/dL — AB (ref 0.50–1.35)
Calcium: 8.5 mg/dL (ref 8.4–10.5)
Chloride: 100 mEq/L (ref 96–112)
GFR calc Af Amer: 38 mL/min — ABNORMAL LOW (ref >90)
GFR calc non Af Amer: 33 mL/min — ABNORMAL LOW (ref >90)
Glucose, Bld: 100 mg/dL — ABNORMAL HIGH (ref 70–99)
Potassium: 5 mEq/L (ref 3.7–5.3)
Sodium: 138 mEq/L (ref 137–147)

## 2014-02-04 LAB — GLUCOSE, CAPILLARY
GLUCOSE-CAPILLARY: 68 mg/dL — AB (ref 70–99)
GLUCOSE-CAPILLARY: 56 mg/dL — AB (ref 70–99)
GLUCOSE-CAPILLARY: 92 mg/dL (ref 70–99)
GLUCOSE-CAPILLARY: 143 mg/dL — AB (ref 70–99)
GLUCOSE-CAPILLARY: 159 mg/dL — AB (ref 70–99)
Glucose-Capillary: 68 mg/dL — ABNORMAL LOW (ref 70–99)
Glucose-Capillary: 73 mg/dL (ref 70–99)

## 2014-02-04 LAB — HEPARIN LEVEL (UNFRACTIONATED): Heparin Unfractionated: 0.36 IU/mL (ref 0.30–0.70)

## 2014-02-04 LAB — PARATHYROID HORMONE, INTACT (NO CA): PTH: 573.9 pg/mL — ABNORMAL HIGH (ref 14.0–72.0)

## 2014-02-04 SURGERY — INSERTION OF DIALYSIS CATHETER
Anesthesia: Monitor Anesthesia Care | Site: Neck | Laterality: Right

## 2014-02-04 MED ORDER — HEPARIN SODIUM (PORCINE) 1000 UNIT/ML IJ SOLN
INTRAMUSCULAR | Status: DC | PRN
  Administered 2014-02-04: 1000 [IU] via INTRAVENOUS

## 2014-02-04 MED ORDER — OXYCODONE HCL 5 MG/5ML PO SOLN: 5.0000 mg | Freq: Once | ORAL | Status: AC | PRN

## 2014-02-04 MED ORDER — FENTANYL CITRATE 0.05 MG/ML IJ SOLN
INTRAMUSCULAR | Status: AC
  Filled 2014-02-04: qty 5

## 2014-02-04 MED ORDER — LIDOCAINE HCL (PF) 1 % IJ SOLN
INTRAMUSCULAR | Status: AC
  Filled 2014-02-04: qty 30

## 2014-02-04 MED ORDER — HYDROMORPHONE HCL PF 1 MG/ML IJ SOLN: 0.2500 mg | INTRAMUSCULAR | Status: DC | PRN

## 2014-02-04 MED ORDER — PROMETHAZINE HCL 25 MG/ML IJ SOLN: 6.2500 mg | INTRAMUSCULAR | Status: DC | PRN

## 2014-02-04 MED ORDER — HEPARIN SODIUM (PORCINE) 1000 UNIT/ML IJ SOLN
INTRAMUSCULAR | Status: DC | PRN
  Administered 2014-02-04: 7000 [IU] via INTRAVENOUS

## 2014-02-04 MED ORDER — LIDOCAINE HCL (CARDIAC) 20 MG/ML IV SOLN
INTRAVENOUS | Status: AC
  Filled 2014-02-04: qty 5

## 2014-02-04 MED ORDER — OXYCODONE HCL 5 MG PO TABS: 5.0000 mg | ORAL_TABLET | Freq: Once | ORAL | Status: AC | PRN

## 2014-02-04 MED ORDER — FENTANYL CITRATE 0.05 MG/ML IJ SOLN
INTRAMUSCULAR | Status: DC | PRN
  Administered 2014-02-04 (×5): 25 ug via INTRAVENOUS

## 2014-02-04 MED ORDER — ONDANSETRON HCL 4 MG/2ML IJ SOLN
INTRAMUSCULAR | Status: AC
  Filled 2014-02-04: qty 2

## 2014-02-04 MED ORDER — PROPOFOL 10 MG/ML IV BOLUS
INTRAVENOUS | Status: AC
Start: 2014-02-04 — End: 2014-02-04
  Filled 2014-02-04: qty 20

## 2014-02-04 MED ORDER — THROMBIN 20000 UNITS EX SOLR
CUTANEOUS | Status: AC
  Filled 2014-02-04: qty 20000

## 2014-02-04 MED ORDER — SODIUM CHLORIDE 0.9 % IV SOLN
INTRAVENOUS | Status: DC | PRN
  Administered 2014-02-04: 11:00:00 via INTRAVENOUS

## 2014-02-04 MED ORDER — HEPARIN SODIUM (PORCINE) 1000 UNIT/ML IJ SOLN
INTRAMUSCULAR | Status: AC
  Filled 2014-02-04: qty 1

## 2014-02-04 MED ORDER — LIDOCAINE HCL (PF) 1 % IJ SOLN
INTRAMUSCULAR | Status: DC | PRN
  Administered 2014-02-04: 30 mL

## 2014-02-04 MED ORDER — HEPARIN SODIUM (PORCINE) 5000 UNIT/ML IJ SOLN
INTRAMUSCULAR | Status: DC | PRN
  Administered 2014-02-04: 12:00:00

## 2014-02-04 MED ORDER — MIDAZOLAM HCL 5 MG/5ML IJ SOLN
INTRAMUSCULAR | Status: DC | PRN
  Administered 2014-02-04: 1 mg via INTRAVENOUS
  Administered 2014-02-04: 0.5 mg via INTRAVENOUS

## 2014-02-04 MED ORDER — MIDAZOLAM HCL 2 MG/2ML IJ SOLN
INTRAMUSCULAR | Status: AC
  Filled 2014-02-04: qty 2

## 2014-02-04 MED ORDER — HEPARIN (PORCINE) IN NACL 100-0.45 UNIT/ML-% IJ SOLN
1550.0000 [IU]/h | INTRAMUSCULAR | Status: DC
  Administered 2014-02-04: 1450 [IU]/h via INTRAVENOUS
  Administered 2014-02-05 (×2): 1550 [IU]/h via INTRAVENOUS
  Filled 2014-02-04 (×6): qty 250

## 2014-02-04 MED ORDER — 0.9 % SODIUM CHLORIDE (POUR BTL) OPTIME
TOPICAL | Status: DC | PRN
  Administered 2014-02-04: 1000 mL

## 2014-02-04 SURGICAL SUPPLY — 64 items
ARMBAND PINK RESTRICT EXTREMIT (MISCELLANEOUS) ×3 IMPLANT
BAG DECANTER FOR FLEXI CONT (MISCELLANEOUS) ×3 IMPLANT
BLADE 10 SAFETY STRL DISP (BLADE) ×3 IMPLANT
CANISTER SUCTION 2500CC (MISCELLANEOUS) ×3 IMPLANT
CATH CANNON HEMO 15F 50CM (CATHETERS) IMPLANT
CATH CANNON HEMO 15FR 19 (HEMODIALYSIS SUPPLIES) IMPLANT
CATH CANNON HEMO 15FR 23CM (HEMODIALYSIS SUPPLIES) ×3 IMPLANT
CATH CANNON HEMO 15FR 31CM (HEMODIALYSIS SUPPLIES) IMPLANT
CATH CANNON HEMO 15FR 32CM (HEMODIALYSIS SUPPLIES) IMPLANT
CATH STRAIGHT 5FR 65CM (CATHETERS) IMPLANT
CHLORAPREP W/TINT 26ML (MISCELLANEOUS) ×3 IMPLANT
CLIP TI MEDIUM 6 (CLIP) ×3 IMPLANT
CLIP TI WIDE RED SMALL 6 (CLIP) ×3 IMPLANT
COVER PROBE W GEL 5X96 (DRAPES) ×3 IMPLANT
COVER SURGICAL LIGHT HANDLE (MISCELLANEOUS) ×3 IMPLANT
DECANTER SPIKE VIAL GLASS SM (MISCELLANEOUS) ×3 IMPLANT
DERMABOND ADHESIVE PROPEN (GAUZE/BANDAGES/DRESSINGS) ×1
DERMABOND ADVANCED (GAUZE/BANDAGES/DRESSINGS) ×1
DERMABOND ADVANCED .7 DNX12 (GAUZE/BANDAGES/DRESSINGS) ×2 IMPLANT
DERMABOND ADVANCED .7 DNX6 (GAUZE/BANDAGES/DRESSINGS) ×2 IMPLANT
DRAIN PENROSE 1/4X12 LTX STRL (WOUND CARE) ×3 IMPLANT
DRAPE C-ARM 42X72 X-RAY (DRAPES) ×3 IMPLANT
DRAPE CHEST BREAST 15X10 FENES (DRAPES) ×3 IMPLANT
ELECT REM PT RETURN 9FT ADLT (ELECTROSURGICAL) ×3
ELECTRODE REM PT RTRN 9FT ADLT (ELECTROSURGICAL) ×2 IMPLANT
GAUZE SPONGE 2X2 8PLY STRL LF (GAUZE/BANDAGES/DRESSINGS) ×2 IMPLANT
GAUZE SPONGE 4X4 16PLY XRAY LF (GAUZE/BANDAGES/DRESSINGS) ×3 IMPLANT
GEL ULTRASOUND 20GR AQUASONIC (MISCELLANEOUS) IMPLANT
GLOVE BIO SURGEON STRL SZ 6.5 (GLOVE) ×18 IMPLANT
GLOVE BIO SURGEON STRL SZ7.5 (GLOVE) ×6 IMPLANT
GLOVE BIOGEL PI IND STRL 6.5 (GLOVE) ×4 IMPLANT
GLOVE BIOGEL PI IND STRL 7.0 (GLOVE) ×2 IMPLANT
GLOVE BIOGEL PI INDICATOR 6.5 (GLOVE) ×2
GLOVE BIOGEL PI INDICATOR 7.0 (GLOVE) ×1
GOWN BRE IMP SLV AUR XL STRL (GOWN DISPOSABLE) ×12 IMPLANT
GOWN STRL REUS W/ TWL LRG LVL3 (GOWN DISPOSABLE) ×6 IMPLANT
GOWN STRL REUS W/TWL LRG LVL3 (GOWN DISPOSABLE) ×3
KIT BASIN OR (CUSTOM PROCEDURE TRAY) ×3 IMPLANT
KIT ROOM TURNOVER OR (KITS) ×3 IMPLANT
LOOP VESSEL MINI RED (MISCELLANEOUS) IMPLANT
NEEDLE 18GX1X1/2 (RX/OR ONLY) (NEEDLE) ×3 IMPLANT
NEEDLE HYPO 25GX1X1/2 BEV (NEEDLE) ×3 IMPLANT
NS IRRIG 1000ML POUR BTL (IV SOLUTION) ×3 IMPLANT
PACK CV ACCESS (CUSTOM PROCEDURE TRAY) ×3 IMPLANT
PACK SURGICAL SETUP 50X90 (CUSTOM PROCEDURE TRAY) IMPLANT
PAD ARMBOARD 7.5X6 YLW CONV (MISCELLANEOUS) ×6 IMPLANT
SET MICROPUNCTURE 5F STIFF (MISCELLANEOUS) IMPLANT
SPONGE GAUZE 2X2 STER 10/PKG (GAUZE/BANDAGES/DRESSINGS) ×1
SPONGE SURGIFOAM ABS GEL 100 (HEMOSTASIS) IMPLANT
SUT ETHILON 3 0 PS 1 (SUTURE) ×3 IMPLANT
SUT PROLENE 7 0 BV 1 (SUTURE) ×3 IMPLANT
SUT VIC AB 3-0 SH 27 (SUTURE) ×1
SUT VIC AB 3-0 SH 27X BRD (SUTURE) ×2 IMPLANT
SUT VICRYL 4-0 PS2 18IN ABS (SUTURE) ×3 IMPLANT
SYR 20CC LL (SYRINGE) ×6 IMPLANT
SYR 5ML LL (SYRINGE) ×3 IMPLANT
SYR CONTROL 10ML LL (SYRINGE) ×3 IMPLANT
SYRINGE 10CC LL (SYRINGE) ×6 IMPLANT
TAPE CLOTH SURG 4X10 WHT LF (GAUZE/BANDAGES/DRESSINGS) ×3 IMPLANT
TOWEL OR 17X24 6PK STRL BLUE (TOWEL DISPOSABLE) ×3 IMPLANT
TOWEL OR 17X26 10 PK STRL BLUE (TOWEL DISPOSABLE) ×3 IMPLANT
UNDERPAD 30X30 INCONTINENT (UNDERPADS AND DIAPERS) ×3 IMPLANT
WATER STERILE IRR 1000ML POUR (IV SOLUTION) ×3 IMPLANT
WIRE AMPLATZ SS-J .035X180CM (WIRE) IMPLANT

## 2014-02-04 NOTE — Interval H&P Note (Signed)
History and Physical Interval Note:  02/04/2014 12:33 PM  [name] [name]  has presented today for surgery, with the diagnosis of End stage renal disease  The various methods of treatment have been discussed with the patient and family. After consideration of risks, benefits and other options for treatment, the patient has consented to  Procedure(s): INSERTION OF DIALYSIS CATHETER (N/A) ARTERIOVENOUS (AV) FISTULA CREATION VS. GRAFT (Right) as a surgical intervention .  The patient's history has been reviewed, patient examined, no change in status, stable for surgery.  I have reviewed the patient's chart and labs.  Questions were answered to the patient's satisfaction.    Pt with left side AICD.  Will place permanent access in right arm to avoid possible venous hypertension.  Plan discussed with pt who agrees to proceed.   Sherren Kerns

## 2014-02-04 NOTE — Transfer of Care (Signed)
Immediate Anesthesia Transfer of Care Note  Patient: [name] [name]  Procedure(s) Performed: Procedure(s): INSERTION OF DIALYSIS CATHETER (Right) Insertion of Right Arm arteriovenous fistula (Right)  Patient Location: PACU  Anesthesia Type:MAC  Level of Consciousness: awake, alert , oriented and patient cooperative  Airway & Oxygen Therapy: Patient Spontanous Breathing and Patient connected to face mask oxygen  Post-op Assessment: Report given to PACU RN, Post -op Vital signs reviewed and stable and Patient moving all extremities  Post vital signs: Reviewed and stable  Complications: No apparent anesthesia complications

## 2014-02-04 NOTE — Progress Notes (Signed)
Arrived from pacu stable, pleasant and requesting meal. Will continue to monitor closely.

## 2014-02-04 NOTE — Op Note (Signed)
Procedure: Ultrasound-guided insertion of Diatek catheter right internal jugular vein, Right brachial cephalic AV fistula  Preoperative diagnosis: End-stage renal disease  Postoperative diagnosis: Same  Anesthesia: Local with IV sedation  Operative findings: 23 cm Diatek catheter right internal jugular vein  Asst: Samantha Rhyne PA-C  Operative details: After obtaining informed consent, the patient was taken to the operating room. The patient was placed in supine position on the operating room table. After adequate sedation the patient's entire neck and chest were prepped and draped in usual sterile fashion. The patient was placed in Trendelenburg position. Ultrasound was used to identify the patient's right internal jugular vein. This had normal compressibility and respiratory variation. Local anesthesia was infiltrated over the right jugular vein.  Using ultrasound guidance, the right internal jugular vein was successfully cannulated.  A 0.035 J-tipped guidewire was threaded into the right internal jugular vein and into the superior vena cava followed by the inferior vena cava under fluoroscopic guidance.   Next sequential 12 and 14 dilators were placed over the guidewire into the right atrium.  A 16 French dilator with a peel-away sheath was then placed over the guidewire into the right atrium.   The guidewire and dilator were removed. A 23 cm Diatek catheter was then placed through the peel away sheath into the right atrium.  The catheter was then tunneled subcutaneously, cut to length, and the hub attached. The catheter was noted to flush and draw easily. The catheter was inspected under fluoroscopy and found with its tip to be in the right atrium without any kinks throughout its course. The catheter was sutured to the skin with nylon sutures. The neck insertion site was closed with Vicryl stitch. The catheter was then loaded with concentrated Heparin solution. A dry sterile dressing was applied.   The old temporary IJ catheter was then unsutured and removed.  Hemostasis was obtained with 10 min direct pressure.  Next attention was turned to the patient's right upper extremity.  The righ upper extremity was prepped and draped in usual sterile fashion.  A transverse incision was then made near the antecubital crease the right arm. The incision was carried into the subcutaneous tissues down to level of the cephalic vein. The cephalic vein was approximately 3.5 mm in diameter. It was of good quality. This was dissected free circumferentially and small side branches ligated and divided between silk ties or clips. Next the brachial artery was dissected free in the medial portion of the incision. The artery was  3-4 mm in diameter. The vessel loops were placed proximal and distal to the planned site of arteriotomy. The patient was given 7000 units of intravenous heparin. After appropriate circulation time, the vessel loops were used to control the artery. A longitudinal opening was made in the brachial artery.  The vein was ligated distally with a 2-0 silk tie. The vein was controlled proximally with a fine bulldog clamp. The vein was then swung over to the artery and sewn end of vein to side of artery using a running 7-0 Prolene suture. Just prior to completion of the anastomosis, everything was fore bled back bled and thoroughly flushed. The anastomosis was secured, vessel loops released, and there was a palpable thrill in the fistula immediately. After hemostasis was obtained, the subcutaneous tissues were reapproximated using a running 3-0 Vicryl suture. The skin was then closed with a 4 Vicryl subcuticular stitch. Dermabond was applied to the skin incision.  The patient had a palpable radial pulse at the end  of the case.  The patient tolerated procedure well and there were no complications. Instrument sponge and needle counts were correct at the end of the case. Chest x-ray will be obtained in the recovery  room.  Fabienne Brunsharles Hondo Nanda, MD [facility] Office: [phone] Pager: [phone]

## 2014-02-04 NOTE — H&P (View-Only) (Signed)
[facility]  Subjective  - Feels ok   Objective 116/45 68 97.2 F (36.2 C) (Oral) 18 98%  Intake/Output Summary (Last 24 hours) at 02/02/14 1113 Last data filed at 02/02/14 1100  Gross per 24 hour  Intake 990.33 ml  Output   4772 ml  Net -3781.67 ml   Some dried blood ooze around right neck cath  Data: Vein map reviewed,  So so cephalic vein at wrist 2-4 mm good upper arm basilic vein 3-5 mm  Assessment/Planning: Should be candidate for left radial cephalic AVF.  Will place this and catheter on Monday.  Please stop heparin on call to OR Monday.  Risk benefits possible complications discussed with daughters and pt.  Including but not limited to bleeding infection non maturation of fistula.  [name] [name] 02/02/2014 11:13 AM --  Laboratory Lab Results:  Recent Labs  02/01/14 0500 02/02/14 0500  WBC 5.7 5.4  HGB 9.0* 8.7*  HCT 27.4* 27.8*  PLT 181 185   BMET  Recent Labs  02/01/14 1915 02/02/14 0500  NA 137 140  K 4.2 4.4  CL 96 101  CO2 27 28  GLUCOSE 149* 75  BUN 49* 37*  CREATININE 1.86* 1.49*  CALCIUM 8.2* 8.5    COAG No results found for this basename: INR, PROTIME   No results found for this basename: PTT        

## 2014-02-04 NOTE — Progress Notes (Signed)
Patient ID: [name] [name], male   DOB: March 06, 1941, 73 y.o.   MRN: [id number]   SUBJECTIVE:   Yesterday CVVHD stopped. Started on IV lasix 120 mg every 6 hours. Day. Weight unchanged.  Creatinine 1.23>1.9.  Denies dyspnea. Wants foley out.   ECHO EF 15-20%.    Scheduled Meds: . albuterol  3 mL Inhalation QID  . aspirin  81 mg Oral Daily  . atorvastatin  80 mg Oral q1800  . budesonide-formoterol  2 puff Inhalation Daily  . cefUROXime (ZINACEF)  IV  1.5 g Intravenous 60 min Pre-Op  . [START ON 02/08/2014] darbepoetin (ARANESP) injection - DIALYSIS  100 mcg Subcutaneous Q Fri-HD  . docusate sodium  100 mg Oral Daily  . furosemide  120 mg Intravenous Q6H  . hydrALAZINE  25 mg Oral 3 times per day  . insulin aspart  0-9 Units Subcutaneous TID WC  . insulin glargine  10 Units Subcutaneous QHS  . isosorbide dinitrate  10 mg Oral Q8H  . levothyroxine  100 mcg Oral QAC breakfast   Continuous Infusions: . sodium chloride Stopped (02/01/14 0800)  . heparin 1,450 Units/hr (02/03/14 1938)  . milrinone Stopped (02/01/14 0800)   PRN Meds:.albuterol, albuterol, ALPRAZolam, HYDROcodone-acetaminophen, HYDROmorphone (DILAUDID) injection, ondansetron (ZOFRAN) IV, ondansetron    Filed Vitals:   02/04/14 0300 02/04/14 0400 02/04/14 0500 02/04/14 0600  BP: 116/58 114/57 116/57 114/57  Pulse: 70 70 70 70  Temp:  98.4 F (36.9 C)    TempSrc:  Oral    Resp: 16     Height:      Weight:   216 lb 0.8 oz (98 kg)   SpO2: 92% 95% 92% 100%    Intake/Output Summary (Last 24 hours) at 02/04/14 0700 Last data filed at 02/04/14 0600  Gross per 24 hour  Intake 1389.5 ml  Output   1409 ml  Net  -19.5 ml    LABS: Basic Metabolic Panel:  Recent Labs  06/00/45 0500 02/02/14 1350 02/03/14 0528 02/04/14 0330  NA 140 138 139 138  K 4.4 4.6 4.7 5.0  CL 101 98 100 100  CO2 28 28 27 29   GLUCOSE 75 148* 97 100*  BUN 37* 30* 23 36*  CREATININE 1.49* 1.39* 1.23 1.90*  CALCIUM 8.5 8.6 8.7 8.5  MG  2.4  --  2.5  --   PHOS 2.8 2.2* 2.2*  --    Liver Function Tests:  Recent Labs  02/02/14 1350 02/03/14 0528  ALBUMIN 3.2* 3.1*   No results found for this basename: LIPASE, AMYLASE,  in the last 72 hours CBC:  Recent Labs  02/03/14 0528 02/04/14 0330  WBC 6.7 7.5  HGB 9.1* 8.9*  HCT 29.0* 28.4*  MCV 92.9 94.7  PLT 181 170   Cardiac Enzymes: No results found for this basename: CKTOTAL, CKMB, CKMBINDEX, TROPONINI,  in the last 72 hours BNP: No components found with this basename: POCBNP,  D-Dimer: No results found for this basename: DDIMER,  in the last 72 hours Hemoglobin A1C: No results found for this basename: HGBA1C,  in the last 72 hours Fasting Lipid Panel: No results found for this basename: CHOL, HDL, LDLCALC, TRIG, CHOLHDL, LDLDIRECT,  in the last 72 hours Thyroid Function Tests: No results found for this basename: TSH, T4TOTAL, FREET3, T3FREE, THYROIDAB,  in the last 72 hours Anemia Panel:  Recent Labs  02/01/14 1400  TIBC 382  IRON 56    RADIOLOGY: No results found.  PHYSICAL EXAM General: NAD. Sitting on the  side of the bed.  Neck: JVP to jaw, no thyromegaly or thyroid nodule. RIJ Lungs: Crackles at bases bilaterally. CV: Nondisplaced PMI.  Heart regular S1/S2, no S3/S4, no murmur. 2+ edema to thighs.  No carotid bruit.  Normal pedal pulses.  Abdomen: Soft, nontender, no hepatosplenomegaly, no distention.  Neurologic: Alert and oriented x 3.  Psych: Normal affect. Extremities: No clubbing or cyanosis. R and LLE 2-3+ edema.   TELEMETRY: Reviewed telemetry pt in underlying atrial fibrillation with v-pacing  ASSESSMENT AND PLAN: 1. 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 He has a CRT-D device. He remains massively volume overloaded on exam. Started on CVVHD 4/30 and stopped 5/3. He was started on lasix 120 mg IV q 6 hours. Urine out put down. Creatinine trending back up. 1.3>1.9. Overall weight down 13 pounds. Add ted hose. Maybe beneficial to  consult palliative care for goals of care.  2. Atrial fibrillation: Appears to be in underlying atrial fibrillation with BiV pacing. Eliquis on hold. On heparin gtt and will stop on call. 3. AKI on CKD: Suspect cardiorenal syndrome probably superimposed on diabetic nephropathy. CVVHD stopped yesterday. Plan for perm cath fistula per nephrology. Renal function trending up.  4. CAD: Continue statin. Pan for  AV fistula.    Amy D Clegg NP-C 02/04/2014 7:00 AM  Patient seen with NP, agree with the above note.  He is still very volume overloaded on exam. I expect that for long-term effective fluid removal, he will need HD.  He is going to get a fistula today.  He had some urine output with IV Lasix.  He is ready to go home.  - Will add low dose bisoprolol 2.5 mg daily.  - Needs more fluid off, scheduling of HD per renal. - I talked to him today about going on warfarin if he is on hemodialysis instead of Eliquis.  He is very adamant that he does not want to take warfarin.  Eliquis 2.5 mg bid is an option for patients on hemodialysis though not studied well.  We will have to aim for him to go home on this I think.  Laurey MoraleDalton S Alyss Granato 02/04/2014

## 2014-02-04 NOTE — Anesthesia Preprocedure Evaluation (Addendum)
Anesthesia Evaluation    Reviewed: Allergy & Precautions, H&P , NPO status , Patient's Chart, lab work & pertinent test results  Airway       Dental   Pulmonary COPDformer smoker,          Cardiovascular hypertension, Pt. on medications + CAD and + Past MI + dysrhythmias Atrial Fibrillation + pacemaker + Cardiac Defibrillator  Left ventricle: The cavity size was mildly dilated. Wall  thickness was normal. Systolic function was severely  reduced. The estimated ejection fraction was in the range of 15% to 20%. Akinesis and scarring of the anteroseptal,  anterior, and apical myocardium; in the distribution of the left anterior descending coronary artery.   Neuro/Psych negative neurological ROS  negative psych ROS   GI/Hepatic GERD-  Medicated,  Endo/Other  diabetesHypothyroidism   Renal/GU Renal InsufficiencyRenal disease     Musculoskeletal   Abdominal   Peds  Hematology   Anesthesia Other Findings   Reproductive/Obstetrics                         Anesthesia Physical Anesthesia Plan  ASA: III  Anesthesia Plan: MAC   Post-op Pain Management:    Induction: Intravenous  Airway Management Planned:   Additional Equipment:   Intra-op Plan:   Post-operative Plan:   Informed Consent:   Plan Discussed with: CRNA, Anesthesiologist and Surgeon  Anesthesia Plan Comments:         Anesthesia Quick Evaluation

## 2014-02-04 NOTE — Progress Notes (Signed)
Dr. Shirlee Latch called regarding mangement of pacemaker/icd while in OR. Told to call [facility] for them to stop icd therapies but keep biventicular pacing while in OR. [employer] rep notified will come and do as per orders.

## 2014-02-04 NOTE — Progress Notes (Signed)
Heparin IV stopped on call to OR. Pt transported report given to OR nurse.

## 2014-02-04 NOTE — Progress Notes (Addendum)
ANTICOAGULATION CONSULT NOTE - Follow Up Consult  Pharmacy Consult for Heparin Indication: atrial fibrillation  No Known Allergies  Patient Measurements: Height: 5\' 9"  (175.3 cm) Weight: 216 lb 0.8 oz (98 kg) IBW/kg (Calculated) : 70.7 Heparin Dosing Weight: 91kg  Vital Signs: Temp: 98.1 F (36.7 C) (05/04 0745) Temp src: Oral (05/04 0745) BP: 109/50 mmHg (05/04 0900) Pulse Rate: 70 (05/04 1000)  Labs:  Recent Labs  02/02/14 0500 02/02/14 1350 02/03/14 0528 02/04/14 0330  HGB 8.7*  --  9.1* 8.9*  HCT 27.8*  --  29.0* 28.4*  PLT 185  --  181 170  HEPARINUNFRC 0.43  --  0.45 0.36  CREATININE 1.49* 1.39* 1.23 1.90*    Estimated Creatinine Clearance: 39.4 ml/min (by C-G formula based on Cr of 1.9).  Assessment: [name] [name] on eliquis pta for afib, transitioned to IV heparin while eliquis on hold for AVF placement today. Heparin level was therapeutic on 1450 units/hr. CBC stable. No bleeding reported.  Goal of Therapy:  Heparin level 0.3-0.7 units/ml Monitor platelets by anticoagulation protocol: Yes   Plan:  1) Follow up resuming heparin after OR 2) Noted plan for eventual transition to coumadin - will order baseline INR  Hessie Diener Tegh Franek 02/04/2014,1:41 PM  Spoke to Dr. Darrick Penna. OK to resume heparin in ~ 6 hours. Will resume at previous rate of 1450 units/hr and follow up daily heparin level (will be ~ 8 hour level).  Hessie Diener [facility] 02/04/2014, 2:37 PM

## 2014-02-04 NOTE — Anesthesia Postprocedure Evaluation (Signed)
Anesthesia Post Note  Patient: [name] [name]  Procedure(s) Performed: Procedure(s) (LRB): INSERTION OF DIALYSIS CATHETER (Right) Insertion of Right Arm arteriovenous fistula (Right)  Anesthesia type: MAC  Patient location: PACU  Post pain: Pain level controlled  Post assessment: Patient's Cardiovascular Status Stable  Last Vitals:  Filed Vitals:   02/04/14 1502  BP: 114/57  Pulse: 70  Temp:   Resp: 15    Post vital signs: Reviewed and stable  Level of consciousness: sedated  Complications: No apparent anesthesia complications

## 2014-02-04 NOTE — Progress Notes (Signed)
Pt complained of feeling like he had a full bladder s/p 120mg  Lasix. Bladder scan performed revealing 51ml.  Will pass on to oncoming shift and continue to monitor closely.

## 2014-02-05 ENCOUNTER — Encounter (HOSPITAL_COMMUNITY): Payer: Self-pay | Admitting: Vascular Surgery

## 2014-02-05 ENCOUNTER — Telehealth: Payer: Self-pay | Admitting: Vascular Surgery

## 2014-02-05 LAB — CBC
HCT: 29.3 % — ABNORMAL LOW (ref 39.0–52.0)
Hemoglobin: 9.1 g/dL — ABNORMAL LOW (ref 13.0–17.0)
MCH: 29.6 pg (ref 26.0–34.0)
MCHC: 31.1 g/dL (ref 30.0–36.0)
MCV: 95.4 fL (ref 78.0–100.0)
Platelets: 189 10*3/uL (ref 150–400)
RBC: 3.07 MIL/uL — AB (ref 4.22–5.81)
RDW: 15.8 % — AB (ref 11.5–15.5)
WBC: 8.5 10*3/uL (ref 4.0–10.5)

## 2014-02-05 LAB — BASIC METABOLIC PANEL
BUN: 50 mg/dL — AB (ref 6–23)
CHLORIDE: 96 meq/L (ref 96–112)
CO2: 27 mEq/L (ref 19–32)
Calcium: 9.2 mg/dL (ref 8.4–10.5)
Creatinine, Ser: 2.33 mg/dL — ABNORMAL HIGH (ref 0.50–1.35)
GFR calc Af Amer: 30 mL/min — ABNORMAL LOW (ref >90)
GFR calc non Af Amer: 26 mL/min — ABNORMAL LOW (ref >90)
Glucose, Bld: 73 mg/dL (ref 70–99)
Potassium: 5.7 mEq/L — ABNORMAL HIGH (ref 3.7–5.3)
SODIUM: 136 meq/L — AB (ref 137–147)

## 2014-02-05 LAB — GLUCOSE, CAPILLARY
Glucose-Capillary: 70 mg/dL (ref 70–99)
Glucose-Capillary: 78 mg/dL (ref 70–99)
Glucose-Capillary: 167 mg/dL — ABNORMAL HIGH (ref 70–99)
Glucose-Capillary: 123 mg/dL — ABNORMAL HIGH (ref 70–99)
Glucose-Capillary: 141 mg/dL — ABNORMAL HIGH (ref 70–99)

## 2014-02-05 LAB — HEPATITIS B SURFACE ANTIGEN: Hepatitis B Surface Ag: NEGATIVE

## 2014-02-05 LAB — HEPATITIS B SURFACE ANTIBODY,QUALITATIVE: Hep B S Ab: NEGATIVE

## 2014-02-05 LAB — PROTIME-INR
INR: 1.13 (ref 0.00–1.49)
PROTHROMBIN TIME: 14.3 s (ref 11.6–15.2)

## 2014-02-05 LAB — HEPARIN LEVEL (UNFRACTIONATED)
HEPARIN UNFRACTIONATED: 0.25 [IU]/mL — AB (ref 0.30–0.70)
Heparin Unfractionated: 0.31 IU/mL (ref 0.30–0.70)

## 2014-02-05 LAB — HEPATITIS B CORE ANTIBODY, TOTAL: Hep B Core Total Ab: NONREACTIVE

## 2014-02-05 MED ORDER — SODIUM CHLORIDE 0.9 % IV SOLN: 100.0000 mL | INTRAVENOUS | Status: DC | PRN

## 2014-02-05 MED ORDER — LIDOCAINE HCL (PF) 1 % IJ SOLN: 5.0000 mL | INTRAMUSCULAR | Status: DC | PRN

## 2014-02-05 MED ORDER — PENTAFLUOROPROP-TETRAFLUOROETH EX AERO: 1.0000 "application " | INHALATION_SPRAY | CUTANEOUS | Status: DC | PRN

## 2014-02-05 MED ORDER — HEPARIN (PORCINE) IN NACL 100-0.45 UNIT/ML-% IJ SOLN
1600.0000 [IU]/h | INTRAMUSCULAR | Status: DC
  Administered 2014-02-06: 1600 [IU]/h via INTRAVENOUS
  Filled 2014-02-05 (×2): qty 250

## 2014-02-05 MED ORDER — HEPARIN SODIUM (PORCINE) 1000 UNIT/ML DIALYSIS: 1000.0000 [IU] | INTRAMUSCULAR | Status: DC | PRN

## 2014-02-05 MED ORDER — ALTEPLASE 2 MG IJ SOLR
2.0000 mg | Freq: Once | INTRAMUSCULAR | Status: AC | PRN
  Filled 2014-02-05: qty 2

## 2014-02-05 MED ORDER — LIDOCAINE-PRILOCAINE 2.5-2.5 % EX CREA: 1.0000 "application " | TOPICAL_CREAM | CUTANEOUS | Status: DC | PRN

## 2014-02-05 MED ORDER — BISOPROLOL FUMARATE 5 MG PO TABS
2.5000 mg | ORAL_TABLET | Freq: Every day | ORAL | Status: DC
  Administered 2014-02-05 – 2014-02-06 (×2): 2.5 mg via ORAL
  Administered 2014-02-07: 12:00:00 via ORAL
  Administered 2014-02-11: 2.5 mg via ORAL
  Filled 2014-02-05 (×7): qty 0.5

## 2014-02-05 MED ORDER — LIDOCAINE-PRILOCAINE 2.5-2.5 % EX CREA
1.0000 "application " | TOPICAL_CREAM | CUTANEOUS | Status: DC | PRN
  Filled 2014-02-05: qty 5

## 2014-02-05 MED ORDER — ALTEPLASE 2 MG IJ SOLR: 2.0000 mg | Freq: Once | INTRAMUSCULAR | Status: DC | PRN

## 2014-02-05 MED ORDER — NEPRO/CARBSTEADY PO LIQD: 237.0000 mL | ORAL | Status: DC | PRN

## 2014-02-05 MED ORDER — HEPARIN SODIUM (PORCINE) 1000 UNIT/ML DIALYSIS
1000.0000 [IU] | INTRAMUSCULAR | Status: DC | PRN
  Filled 2014-02-05: qty 1

## 2014-02-05 MED ORDER — NEPRO/CARBSTEADY PO LIQD
237.0000 mL | ORAL | Status: DC | PRN
  Filled 2014-02-05: qty 237

## 2014-02-05 MED ORDER — RENA-VITE PO TABS
1.0000 | ORAL_TABLET | Freq: Every day | ORAL | Status: DC
  Administered 2014-02-05 – 2014-02-10 (×6): 1 via ORAL
  Filled 2014-02-05 (×7): qty 1

## 2014-02-05 NOTE — Progress Notes (Signed)
Patient ID: [name] ReelBernie [name], male   DOB: 01/27/1941, 73 y.o.   MRN: [id number]    Poor UOP on high dose Lasix.  He got AV fistula and HD catheter yesterday.    ECHO EF 15-20%.    Scheduled Meds: . albuterol  3 mL Inhalation QID  . aspirin  81 mg Oral Daily  . atorvastatin  80 mg Oral q1800  . budesonide-formoterol  2 puff Inhalation Daily  . [START ON 02/08/2014] darbepoetin (ARANESP) injection - DIALYSIS  100 mcg Subcutaneous Q Fri-HD  . docusate sodium  100 mg Oral Daily  . insulin aspart  0-9 Units Subcutaneous TID WC  . insulin glargine  10 Units Subcutaneous QHS  . isosorbide dinitrate  10 mg Oral Q8H  . levothyroxine  100 mcg Oral QAC breakfast  . multivitamin  1 tablet Oral QHS   Continuous Infusions: . sodium chloride 5 mL/hr at 02/04/14 1956  . heparin 1,550 Units/hr (02/05/14 0424)  . milrinone Stopped (02/01/14 0800)   PRN Meds:.albuterol, albuterol, ALPRAZolam, HYDROcodone-acetaminophen, HYDROmorphone (DILAUDID) injection, HYDROmorphone (DILAUDID) injection, ondansetron (ZOFRAN) IV, ondansetron, promethazine    Filed Vitals:   02/05/14 0600 02/05/14 0700 02/05/14 0750 02/05/14 0813  BP: 107/47 114/49    Pulse: 70 66    Temp:   98.3 F (36.8 C)   TempSrc:   Oral   Resp:  17    Height:      Weight:      SpO2: 98% 100%  99%    Intake/Output Summary (Last 24 hours) at 02/05/14 0824 Last data filed at 02/05/14 0600  Gross per 24 hour  Intake 787.77 ml  Output    885 ml  Net -97.23 ml    LABS: Basic Metabolic Panel:  Recent Labs  08/65/7803/11/19 1350 02/03/14 0528 02/04/14 0330 02/05/14 0305  NA 138 139 138 136*  K 4.6 4.7 5.0 5.7*  CL 98 100 100 96  CO2 28 27 29 27   GLUCOSE 148* 97 100* 73  BUN 30* 23 36* 50*  CREATININE 1.39* 1.23 1.90* 2.33*  CALCIUM 8.6 8.7 8.5 9.2  MG  --  2.5  --   --   PHOS 2.2* 2.2*  --   --    Liver Function Tests:  Recent Labs  02/02/14 1350 02/03/14 0528  ALBUMIN 3.2* 3.1*   No results found for this basename:  LIPASE, AMYLASE,  in the last 72 hours CBC:  Recent Labs  02/04/14 0330 02/05/14 0305  WBC 7.5 8.5  HGB 8.9* 9.1*  HCT 28.4* 29.3*  MCV 94.7 95.4  PLT 170 189   Cardiac Enzymes: No results found for this basename: CKTOTAL, CKMB, CKMBINDEX, TROPONINI,  in the last 72 hours BNP: No components found with this basename: POCBNP,  D-Dimer: No results found for this basename: DDIMER,  in the last 72 hours Hemoglobin A1C: No results found for this basename: HGBA1C,  in the last 72 hours Fasting Lipid Panel: No results found for this basename: CHOL, HDL, LDLCALC, TRIG, CHOLHDL, LDLDIRECT,  in the last 72 hours Thyroid Function Tests: No results found for this basename: TSH, T4TOTAL, FREET3, T3FREE, THYROIDAB,  in the last 72 hours Anemia Panel: No results found for this basename: VITAMINB12, FOLATE, FERRITIN, TIBC, IRON, RETICCTPCT,  in the last 72 hours  RADIOLOGY: No results found.  PHYSICAL EXAM General: NAD. Sitting on the side of the bed.  Neck: JVP to jaw, no thyromegaly or thyroid nodule. RIJ Lungs: Crackles at bases bilaterally. CV: Nondisplaced PMI.  Heart  regular S1/S2, no S3/S4, no murmur. 1+ edema to thighs.  No carotid bruit.  Normal pedal pulses.  Abdomen: Soft, nontender, no hepatosplenomegaly, no distention.  Neurologic: Alert and oriented x 3.  Psych: Normal affect. Extremities: No clubbing or cyanosis.   TELEMETRY: Reviewed telemetry pt in underlying atrial fibrillation with v-pacing  ASSESSMENT AND PLAN: 1. 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 He has a CRT-D device. He remains volume overloaded on exam. Started on CVVHD 4/30 and stopped 5/3. He was started on lasix 120 mg IV q 6 hours. Urine output poor, creatinine and K rising. - Plan HD today.  - Hold hydralazine/isordil with initiation of HD.  - Can continue low dose bisoprolol.  2. Atrial fibrillation: Appears to be in underlying atrial fibrillation with BiV pacing. Eliquis on hold. On heparin gtt right  now. I talked to him today about going on warfarin on hemodialysis instead of Eliquis.  He is very adamant that he does not want to take warfarin.  Eliquis 2.5 mg bid is an option for patients on hemodialysis though not studied well.  We will have to aim for him to go home on this I think. 3. AKI on CKD: Suspect cardiorenal syndrome probably superimposed on diabetic nephropathy. CVVHD stopped yesterday. HD to start today.  4. CAD: Continue statin.   Laurey Morale 02/05/2014

## 2014-02-05 NOTE — Progress Notes (Signed)
ANTICOAGULATION CONSULT NOTE - Follow Up Consult  Pharmacy Consult for Heparin Indication: atrial fibrillation  No Known Allergies  Patient Measurements: Height: 5\' 9"  (175.3 cm) Weight: 210 lb 1.6 oz (95.3 kg) IBW/kg (Calculated) : 70.7 Heparin Dosing Weight: 91kg  Vital Signs: Temp: 98.2 F (36.8 C) (05/05 1222) Temp src: Oral (05/05 1222) BP: 112/57 mmHg (05/05 1430) Pulse Rate: 70 (05/05 1430)  Labs:  Recent Labs  02/03/14 0528 02/04/14 0330 02/05/14 0305 02/05/14 1300  HGB 9.1* 8.9* 9.1*  --   HCT 29.0* 28.4* 29.3*  --   PLT 181 170 189  --   LABPROT  --   --  14.3  --   INR  --   --  1.13  --   HEPARINUNFRC 0.45 0.36 0.25* 0.31  CREATININE 1.23 1.90* 2.33*  --     Estimated Creatinine Clearance: 31.7 ml/min (by C-G formula based on Cr of 2.33).  Medications: Heparin @ 1550 units/hr  Assessment: 72yom s/p HD fistula placement yesterday, resumed on IV heparin for afib (home eliquis on hold). Heparin level is therapeutic after rate increase this morning, but on the low end. CBC is stable. No bleeding reported.   Goal of Therapy:  Heparin level 0.3-0.7 units/ml Monitor platelets by anticoagulation protocol: Yes   Plan:  1) Increase heparin slightly to 1600 units/hr 2) Follow up heparin level, CBC in AM  Hessie Diener Annastasia Haskins 02/05/2014,2:37 PM

## 2014-02-05 NOTE — Progress Notes (Signed)
CARDIAC REHAB PHASE I   PRE:  Rate/Rhythm: 70 paced    BP: sitting 111/35    SaO2: 84 RA, 92 2L  MODE:  Ambulation: 60 ft   POST:  Rate/Rhythm: 70 paced    BP: sitting 116/41     SaO2: 88 2L, 88 3L, 95 4L   Pt very willing to walk. Used assist x2, 2-4 L O2, RW, gait belt. Able to stand with assist. Fairly steady however pt became DOE. Rest x1, SAO2 90 2L, increased to 3L but still 88-90 therefore increased to 4L. Pt very exhausted and SOB so sat to rest. Decreased SOB with rest and SaO2 increased. Pt felt he could not walk back to room. Rolled back and pt pivoted to recliner. SaO2 95 4L. Returned to 2L in [employer]. Pt motivated. Would benefit from PT c/s. 0922-1000  Megan Salon CES, ACSM 02/05/2014 10:14 AM

## 2014-02-05 NOTE — Progress Notes (Signed)
Arrived back from dialysis treatment stable, on monitor.

## 2014-02-05 NOTE — Progress Notes (Signed)
1. 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 CHF EF 20 %;CAD, BiV ICD 3. Anemia started aranesp 4 S/P RUE AVF 5 AFIB  Plan: HD today and in AM, D/C Hydralazine for hemodynamic stability  Subjective: Interval History: S/P AVF and PC  Objective: Vital signs in last 24 hours: Temp:  [96.5 F (35.8 C)-99 F (37.2 C)] 98.3 F (36.8 C) (05/05 0750) Pulse Rate:  [66-72] 66 (05/05 0700) Resp:  [14-38] 17 (05/05 0700) BP: (104-129)/(43-58) 114/49 mmHg (05/05 0700) SpO2:  [93 %-100 %] 100 % (05/05 0700) Weight:  [95.4 kg (210 lb 5.1 oz)] 95.4 kg (210 lb 5.1 oz) (05/05 0449) Weight change: -2.6 kg (-5 lb 11.7 oz)  Intake/Output from previous day: 05/04 0701 - 05/05 0700 In: 787.8 [P.O.:320; I.V.:405.8; IV Piggyback:62] Out: 950 [Urine:950] Intake/Output this shift:    General appearance: alert and cooperative Resp: clear to auscultation bilaterally Cardio: irregularly irregular rhythm GI: soft, non-tender; bowel sounds normal; no masses,  no organomegaly and protuberant Extremities: edema 1-2+  Lab Results:  Recent Labs  02/04/14 0330 02/05/14 0305  WBC 7.5 8.5  HGB 8.9* 9.1*  HCT 28.4* 29.3*  PLT 170 189   BMET:  Recent Labs  02/04/14 0330 02/05/14 0305  NA 138 136*  K 5.0 5.7*  CL 100 96  CO2 29 27  GLUCOSE 100* 73  BUN 36* 50*  CREATININE 1.90* 2.33*  CALCIUM 8.5 9.2   No results found for this basename: PTH,  in the last 72 hours Iron Studies: No results found for this basename: IRON, TIBC, TRANSFERRIN, FERRITIN,  in the last 72 hours Studies/Results: US Renal  02/03/2014   CLINICAL DATA:  Acute renal insufficiency.  Evaluate for obstruction  EXAM: RENAL/URINARY TRACT ULTRASOUND COMPLETE  COMPARISON:  11/15/2013  FINDINGS: Right Kidney:  Length: 11.4 cm. Echogenicity within normal limits. No mass or hydronephrosis visualized.  Left Kidney:  Length: 12.4 cm. Echogenicity within normal limits. No mass or hydronephrosis visualized.  Bladder:  Collapsed  around a Foley catheter.  Other:  Left pleural effusion and mild ascites identified  IMPRESSION: 1. No obstructive uropathy.   Electronically Signed   By: Signa Kell M.D.   On: 02/03/2014 11:31   Dg Chest Port 1 View  02/04/2014   CLINICAL DATA:  Status post dialysis catheter placement.  EXAM: PORTABLE CHEST - 1 VIEW  COMPARISON:  01/31/2014  FINDINGS: A dialysis catheter has been placed. Its tip to difficult to visualize, secondary to overlying pacer. Likely low SVC and high right atrium.  No pneumothorax. Cardiomegaly accentuated by AP portable technique. Small left pleural effusion. Decreased lung volumes with similar to increased moderate congestive failure. Improved right and persistent left base airspace disease.  IMPRESSION: Dialysis catheter with tips which are difficult to visualize. No pneumothorax.  Congestive heart failure with persistent small left pleural effusion and adjacent atelectasis or infection.   Electronically Signed   By: Jeronimo Greaves M.D.   On: 02/04/2014 17:28   Dg Fluoro Guide Cv Line-no Report  02/04/2014   CLINICAL DATA: diatek catheter insertion   FLOURO GUIDE CV LINE  Fluoroscopy was utilized by the requesting physician.  No radiographic  interpretation.    Scheduled: . albuterol  3 mL Inhalation QID  . aspirin  81 mg Oral Daily  . atorvastatin  80 mg Oral q1800  . budesonide-formoterol  2 puff Inhalation Daily  . [START ON 02/08/2014] darbepoetin (ARANESP) injection - DIALYSIS  100 mcg Subcutaneous Q Fri-HD  . docusate sodium  100 mg Oral Daily  . hydrALAZINE  25 mg Oral 3 times per day  . insulin aspart  0-9 Units Subcutaneous TID WC  . insulin glargine  10 Units Subcutaneous QHS  . isosorbide dinitrate  10 mg Oral Q8H  . levothyroxine  100 mcg Oral QAC breakfast     LOS: 7 days   Lauris PoagAlvin C Audia Amick 02/05/2014,8:10 AM

## 2014-02-05 NOTE — Procedures (Signed)
Tolerating hemodialysis treatment #1. Some drop in BP limiting goal today. Lauris Poag

## 2014-02-05 NOTE — Progress Notes (Signed)
ANTICOAGULATION CONSULT NOTE - Follow Up Consult  Pharmacy Consult for Heparin  Indication: atrial fibrillation  No Known Allergies  Patient Measurements: Height: 5\' 9"  (175.3 cm) Weight: 216 lb 0.8 oz (98 kg) IBW/kg (Calculated) : 70.7  Labs:  Recent Labs  02/02/14 1350 02/03/14 0528 02/04/14 0330 02/05/14 0305  HGB  --  9.1* 8.9* 9.1*  HCT  --  29.0* 28.4* 29.3*  PLT  --  181 170 189  LABPROT  --   --   --  14.3  INR  --   --   --  1.13  HEPARINUNFRC  --  0.45 0.36 0.25*  CREATININE 1.39* 1.23 1.90*  --    Estimated Creatinine Clearance: 39.4 ml/min (by C-G formula based on Cr of 1.9).  Medications:  Heparin 1450 units/hr  Assessment: 74 y/o M on heparin for afib. Heparin level after re-starting yesterday after procedure is 0.25. Other labs as above. No issues per RN.   Goal of Therapy:  Heparin level 0.3-0.7 units/ml Monitor platelets by anticoagulation protocol: Yes   Plan:  -Increase heparin to 1550 units/hr -1300 HL -Daily CBC/HL -Monitor for bleeding  Abran Duke 02/05/2014,4:21 AM

## 2014-02-05 NOTE — Telephone Encounter (Addendum)
Message copied by Fredrich Birks on Tue Feb 05, 2014  2:41 PM ------      Message from: Lorin Mercy K      Created: Mon Feb 04, 2014  1:43 PM      Regarding: schedule                   ----- Message -----         From: Dara Lords, PA-C         Sent: 02/04/2014   1:35 PM           To: [facility] Charge Pool            S/p right brachiocephalic AVF and diatek cath placement 02/04/14.  F/u with Dr. Darrick Penna in 6 weeks wit duplex of fistula.            Thanks,      Samantha ------  02/05/14: no answer, mailed letter, dpm

## 2014-02-06 LAB — BASIC METABOLIC PANEL
BUN: 42 mg/dL — AB (ref 6–23)
CHLORIDE: 99 meq/L (ref 96–112)
CO2: 29 mEq/L (ref 19–32)
CREATININE: 2.24 mg/dL — AB (ref 0.50–1.35)
Calcium: 8.8 mg/dL (ref 8.4–10.5)
GFR calc Af Amer: 31 mL/min — ABNORMAL LOW (ref >90)
GFR calc non Af Amer: 27 mL/min — ABNORMAL LOW (ref >90)
GLUCOSE: 108 mg/dL — AB (ref 70–99)
POTASSIUM: 4.6 meq/L (ref 3.7–5.3)
Sodium: 138 mEq/L (ref 137–147)

## 2014-02-06 LAB — CBC
HEMATOCRIT: 27.2 % — AB (ref 39.0–52.0)
Hemoglobin: 8.3 g/dL — ABNORMAL LOW (ref 13.0–17.0)
MCH: 29.4 pg (ref 26.0–34.0)
MCHC: 30.5 g/dL (ref 30.0–36.0)
MCV: 96.5 fL (ref 78.0–100.0)
Platelets: 156 10*3/uL (ref 150–400)
RBC: 2.82 MIL/uL — AB (ref 4.22–5.81)
RDW: 16.1 % — ABNORMAL HIGH (ref 11.5–15.5)
WBC: 6.4 10*3/uL (ref 4.0–10.5)

## 2014-02-06 LAB — GLUCOSE, CAPILLARY
Glucose-Capillary: 108 mg/dL — ABNORMAL HIGH (ref 70–99)
Glucose-Capillary: 158 mg/dL — ABNORMAL HIGH (ref 70–99)
Glucose-Capillary: 133 mg/dL — ABNORMAL HIGH (ref 70–99)
Glucose-Capillary: 326 mg/dL — ABNORMAL HIGH (ref 70–99)

## 2014-02-06 LAB — HEPARIN LEVEL (UNFRACTIONATED): Heparin Unfractionated: 0.42 IU/mL (ref 0.30–0.70)

## 2014-02-06 MED ORDER — APIXABAN 2.5 MG PO TABS
2.5000 mg | ORAL_TABLET | Freq: Two times a day (BID) | ORAL | Status: DC
  Administered 2014-02-06 – 2014-02-11 (×10): 2.5 mg via ORAL
  Filled 2014-02-06 (×12): qty 1

## 2014-02-06 NOTE — Progress Notes (Signed)
PT Cancellation Note  Patient Details Name: [name] [name] MRN: [id number] DOB: 12/27/1940   Cancelled Treatment:    Reason Eval/Treat Not Completed: Patient at procedure or test/unavailable. Will attempt again later this afternoon, time permitting.    Ruthann Cancer 02/06/2014, 3:16 PM  Ruthann Cancer, PT, DPT Acute Rehabilitation Services Pager: [phone]

## 2014-02-06 NOTE — Progress Notes (Signed)
Report called to receiving nurse, Zollie Scale, RN. Belongings taken to new room 2W13. Pt currently in dialysis and will transport to new room once treatment complete.  Dawson Bills, RN

## 2014-02-06 NOTE — Procedures (Signed)
Stable so far on dialysis. BP soft but well tolerated. Pt reports legs feel lighter.  Remains on oxygen. ACPowell, MD

## 2014-02-06 NOTE — Progress Notes (Signed)
Pt received into room 2w13, pt resting in bed on 3L [place] with no complaints, pt oriented to call bell and room, tele placed on pt, vitals taken, will continue to monitor Archie Balboa, RN

## 2014-02-06 NOTE — Progress Notes (Signed)
1. CKD stage 5 Cardiorenal syndrome s/p CVVHD and first HD yesterday with 1 liter off and some BP drop 2. CHF EF 20 %;CAD, BiV ICD  3. Anemia started aranesp  4 S/P RUE AVF  5 AFIB  Plan: HD today again  Subjective: Interval History: First HD yesterday  Objective: Vital signs in last 24 hours: Temp:  [97.9 F (36.6 C)-98.6 F (37 C)] 97.9 F (36.6 C) (05/06 0400) Pulse Rate:  [37-71] 69 (05/06 0700) Resp:  [13-20] 20 (05/05 2000) BP: (88-112)/(33-61) 104/46 mmHg (05/06 0700) SpO2:  [90 %-100 %] 97 % (05/06 0700) Weight:  [94.2 kg (207 lb 10.8 oz)-95.6 kg (210 lb 12.2 oz)] 95.6 kg (210 lb 12.2 oz) (05/06 0438) Weight change: -0.1 kg (-3.5 oz)  Intake/Output from previous day: 05/05 0701 - 05/06 0700 In: 1235.6 [P.O.:720; I.V.:515.6] Out: 1120 [Urine:120] Intake/Output this shift:    General appearance: alert and cooperative Chest wall: no tenderness Cardio: irregularly irregular rhythm GI: soft, non-tender; bowel sounds normal; no masses,  no organomegaly Extremities: edema 2-3+ LE  Lab Results:  Recent Labs  02/05/14 0305 02/06/14 0243  WBC 8.5 6.4  HGB 9.1* 8.3*  HCT 29.3* 27.2*  PLT 189 156   BMET:  Recent Labs  02/05/14 0305 02/06/14 0243  NA 136* 138  K 5.7* 4.6  CL 96 99  CO2 27 29  GLUCOSE 73 108*  BUN 50* 42*  CREATININE 2.33* 2.24*  CALCIUM 9.2 8.8   No results found for this basename: PTH,  in the last 72 hours Iron Studies: No results found for this basename: IRON, TIBC, TRANSFERRIN, FERRITIN,  in the last 72 hours Studies/Results: Dg Chest Port 1 View  02/04/2014   CLINICAL DATA:  Status post dialysis catheter placement.  EXAM: PORTABLE CHEST - 1 VIEW  COMPARISON:  01/31/2014  FINDINGS: A dialysis catheter has been placed. Its tip to difficult to visualize, secondary to overlying pacer. Likely low SVC and high right atrium.  No pneumothorax. Cardiomegaly accentuated by AP portable technique. Small left pleural effusion. Decreased lung volumes  with similar to increased moderate congestive failure. Improved right and persistent left base airspace disease.  IMPRESSION: Dialysis catheter with tips which are difficult to visualize. No pneumothorax.  Congestive heart failure with persistent small left pleural effusion and adjacent atelectasis or infection.   Electronically Signed   By: Jeronimo Greaves M.D.   On: 02/04/2014 17:28   Dg Fluoro Guide Cv Line-no Report  02/04/2014   CLINICAL DATA: diatek catheter insertion   FLOURO GUIDE CV LINE  Fluoroscopy was utilized by the requesting physician.  No radiographic  interpretation.    Scheduled: . albuterol  3 mL Inhalation QID  . apixaban  2.5 mg Oral BID  . atorvastatin  80 mg Oral q1800  . bisoprolol  2.5 mg Oral Daily  . budesonide-formoterol  2 puff Inhalation Daily  . [START ON 02/08/2014] darbepoetin (ARANESP) injection - DIALYSIS  100 mcg Subcutaneous Q Fri-HD  . docusate sodium  100 mg Oral Daily  . insulin aspart  0-9 Units Subcutaneous TID WC  . insulin glargine  10 Units Subcutaneous QHS  . levothyroxine  100 mcg Oral QAC breakfast  . multivitamin  1 tablet Oral QHS     LOS: 8 days   Lauris Poag 02/06/2014,8:39 AM

## 2014-02-06 NOTE — Progress Notes (Signed)
Pt with some oozing from  Right antecubital site.  No obvious active bleeding no hematoma.  Some forearm erythema.  Does not appear infected.  Easily palpable thrill.  Catheter site clean.  Ok to wash incision with soap and water gently.  Follow up in 1 month.   Call if further issues with bleeding or swelling  Fabienne Bruns, MD [facility] Office: [phone] Pager: [phone]

## 2014-02-06 NOTE — Progress Notes (Signed)
Patient ID: [name] [name], male   DOB: 02/05/1941, 73 y.o.   MRN: [id number]    HD yesterday, tolerated well.  Some fall in BP per notes.  Oxygen saturation remains low on room air.  Weight down from admission.   ECHO EF 15-20%.    Scheduled Meds: . albuterol  3 mL Inhalation QID  . apixaban  2.5 mg Oral BID  . aspirin  81 mg Oral Daily  . atorvastatin  80 mg Oral q1800  . bisoprolol  2.5 mg Oral Daily  . budesonide-formoterol  2 puff Inhalation Daily  . [START ON 02/08/2014] darbepoetin (ARANESP) injection - DIALYSIS  100 mcg Subcutaneous Q Fri-HD  . docusate sodium  100 mg Oral Daily  . insulin aspart  0-9 Units Subcutaneous TID WC  . insulin glargine  10 Units Subcutaneous QHS  . levothyroxine  100 mcg Oral QAC breakfast  . multivitamin  1 tablet Oral QHS   Continuous Infusions: . sodium chloride 5 mL/hr at 02/05/14 1900  . milrinone Stopped (02/01/14 0800)   PRN Meds:.sodium chloride, sodium chloride, albuterol, albuterol, ALPRAZolam, feeding supplement (NEPRO CARB STEADY), heparin, HYDROcodone-acetaminophen, HYDROmorphone (DILAUDID) injection, HYDROmorphone (DILAUDID) injection, lidocaine (PF), lidocaine-prilocaine, ondansetron (ZOFRAN) IV, ondansetron, pentafluoroprop-tetrafluoroeth, promethazine    Filed Vitals:   02/06/14 0400 02/06/14 0438 02/06/14 0600 02/06/14 0700  BP: 95/42  106/49 104/46  Pulse: 71  70 69  Temp: 97.9 F (36.6 C)     TempSrc: Axillary     Resp:      Height:      Weight:  95.6 kg (210 lb 12.2 oz)    SpO2: 91%  98% 97%    Intake/Output Summary (Last 24 hours) at 02/06/14 0801 Last data filed at 02/06/14 0700  Gross per 24 hour  Intake 1230.63 ml  Output   1000 ml  Net 230.63 ml    LABS: Basic Metabolic Panel:  Recent Labs  44/12/4703/05/15 0305 02/06/14 0243  NA 136* 138  K 5.7* 4.6  CL 96 99  CO2 27 29  GLUCOSE 73 108*  BUN 50* 42*  CREATININE 2.33* 2.24*  CALCIUM 9.2 8.8   Liver Function Tests: No results found for this basename:  AST, ALT, ALKPHOS, BILITOT, PROT, ALBUMIN,  in the last 72 hours No results found for this basename: LIPASE, AMYLASE,  in the last 72 hours CBC:  Recent Labs  02/05/14 0305 02/06/14 0243  WBC 8.5 6.4  HGB 9.1* 8.3*  HCT 29.3* 27.2*  MCV 95.4 96.5  PLT 189 156   Cardiac Enzymes: No results found for this basename: CKTOTAL, CKMB, CKMBINDEX, TROPONINI,  in the last 72 hours BNP: No components found with this basename: POCBNP,  D-Dimer: No results found for this basename: DDIMER,  in the last 72 hours Hemoglobin A1C: No results found for this basename: HGBA1C,  in the last 72 hours Fasting Lipid Panel: No results found for this basename: CHOL, HDL, LDLCALC, TRIG, CHOLHDL, LDLDIRECT,  in the last 72 hours Thyroid Function Tests: No results found for this basename: TSH, T4TOTAL, FREET3, T3FREE, THYROIDAB,  in the last 72 hours Anemia Panel: No results found for this basename: VITAMINB12, FOLATE, FERRITIN, TIBC, IRON, RETICCTPCT,  in the last 72 hours  RADIOLOGY: No results found.  PHYSICAL EXAM General: NAD. Sitting on the side of the bed.  Neck: JVP to jaw, no thyromegaly or thyroid nodule. RIJ Lungs: Crackles at bases bilaterally. CV: Nondisplaced PMI.  Heart regular S1/S2, no S3/S4, no murmur. 1+ edema to thighs.  No  carotid bruit.  Normal pedal pulses.  Abdomen: Soft, nontender, no hepatosplenomegaly, no distention.  Neurologic: Alert and oriented x 3.  Psych: Normal affect. Extremities: No clubbing or cyanosis.   TELEMETRY: Reviewed telemetry pt in underlying atrial fibrillation with v-pacing  ASSESSMENT AND PLAN: 1. 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 He has a CRT-D device. He remains volume overloaded on exam. Started on CVVHD 4/30 and stopped 5/3. He had his first HD on 5/6.  - Plan HD again today for fluid removal.  - Off hydralazine/isordil with initiation of HD.  - If possible, continue low dose bisoprolol but can stop if BP at HD becomes an issue.   2. Atrial  fibrillation: Appears to be in underlying atrial fibrillation with BiV pacing. I spoke with him about going on warfarin on hemodialysis instead of Eliquis.  He is very adamant that he does not want to take warfarin.  Eliquis 2.5 mg bid is an option for patients on hemodialysis though not studied well.  I will stop heparin today and start Eliquis 2.5 mg bid.  3. AKI on CKD: Suspect cardiorenal syndrome probably superimposed on diabetic nephropathy. Now getting HD. 4. CAD: Continue statin. 5. Disposition: Will need PT evaluation.  Will likely need home oxygen.  Probably home by end of week, will depend on how things go with HD.    Laurey Morale 02/06/2014

## 2014-02-06 NOTE — Progress Notes (Signed)
CARDIAC REHAB PHASE I   PRE:  Rate/Rhythm: 70 pacing  BP:  Supine:   Sitting: 98/46  Standing:    SaO2: 100% 5L  MODE:  Ambulation: 250 ft   POST:  Rate/Rhythm: 72 paced  BP:  Supine:   Sitting: 95/41  Standing:    SaO2: 88% 3L. To 4L to keep sats 91%  0808-0840 Pt walked 250 ft with gait belt use and rolling walker and asst x 2. Started out on 3L but had to go to 4L oxygen to keep sats up. Followed pt with wheelchair and he sat once to rest. Stronger today. To recliner after walk. Right arm very red and painful to pt. Left on 4 L. May be able to decrease to 3L resting.   Luetta Nutting, RN BSN  02/06/2014 8:34 AM

## 2014-02-07 LAB — CBC
HCT: 29.2 % — ABNORMAL LOW (ref 39.0–52.0)
HCT: 26.3 % — ABNORMAL LOW (ref 39.0–52.0)
Hemoglobin: 9 g/dL — ABNORMAL LOW (ref 13.0–17.0)
Hemoglobin: 8.2 g/dL — ABNORMAL LOW (ref 13.0–17.0)
MCH: 29.4 pg (ref 26.0–34.0)
MCH: 29.7 pg (ref 26.0–34.0)
MCHC: 30.8 g/dL (ref 30.0–36.0)
MCHC: 31.2 g/dL (ref 30.0–36.0)
MCV: 95.4 fL (ref 78.0–100.0)
MCV: 95.3 fL (ref 78.0–100.0)
PLATELETS: 145 10*3/uL — AB (ref 150–400)
PLATELETS: 150 10*3/uL (ref 150–400)
RBC: 3.06 MIL/uL — ABNORMAL LOW (ref 4.22–5.81)
RBC: 2.76 MIL/uL — AB (ref 4.22–5.81)
RDW: 16 % — ABNORMAL HIGH (ref 11.5–15.5)
RDW: 16.1 % — ABNORMAL HIGH (ref 11.5–15.5)
WBC: 7.3 10*3/uL (ref 4.0–10.5)
WBC: 6 10*3/uL (ref 4.0–10.5)

## 2014-02-07 LAB — GLUCOSE, CAPILLARY
GLUCOSE-CAPILLARY: 229 mg/dL — AB (ref 70–99)
Glucose-Capillary: 165 mg/dL — ABNORMAL HIGH (ref 70–99)
Glucose-Capillary: 206 mg/dL — ABNORMAL HIGH (ref 70–99)
Glucose-Capillary: 73 mg/dL (ref 70–99)

## 2014-02-07 LAB — BASIC METABOLIC PANEL
BUN: 35 mg/dL — ABNORMAL HIGH (ref 6–23)
CALCIUM: 8.9 mg/dL (ref 8.4–10.5)
CO2: 25 mEq/L (ref 19–32)
Chloride: 98 mEq/L (ref 96–112)
Creatinine, Ser: 2.23 mg/dL — ABNORMAL HIGH (ref 0.50–1.35)
GFR calc Af Amer: 32 mL/min — ABNORMAL LOW (ref >90)
GFR, EST NON AFRICAN AMERICAN: 27 mL/min — AB (ref >90)
GLUCOSE: 143 mg/dL — AB (ref 70–99)
Potassium: 4.6 mEq/L (ref 3.7–5.3)
SODIUM: 138 meq/L (ref 137–147)

## 2014-02-07 LAB — RENAL FUNCTION PANEL
ALBUMIN: 3 g/dL — AB (ref 3.5–5.2)
BUN: 45 mg/dL — ABNORMAL HIGH (ref 6–23)
CALCIUM: 8.8 mg/dL (ref 8.4–10.5)
CHLORIDE: 95 meq/L — AB (ref 96–112)
CO2: 27 mEq/L (ref 19–32)
CREATININE: 2.92 mg/dL — AB (ref 0.50–1.35)
GFR calc Af Amer: 23 mL/min — ABNORMAL LOW (ref >90)
GFR, EST NON AFRICAN AMERICAN: 20 mL/min — AB (ref >90)
Glucose, Bld: 169 mg/dL — ABNORMAL HIGH (ref 70–99)
Phosphorus: 4.4 mg/dL (ref 2.3–4.6)
Potassium: 4.4 mEq/L (ref 3.7–5.3)
SODIUM: 135 meq/L — AB (ref 137–147)

## 2014-02-07 MED ORDER — IPRATROPIUM-ALBUTEROL 0.5-2.5 (3) MG/3ML IN SOLN
3.0000 mL | Freq: Three times a day (TID) | RESPIRATORY_TRACT | Status: DC
  Administered 2014-02-07 – 2014-02-10 (×8): 3 mL via RESPIRATORY_TRACT
  Filled 2014-02-07 (×12): qty 3

## 2014-02-07 NOTE — Progress Notes (Addendum)
[facility]  Subjective  - Doing well " my arm is a little sore."   Objective 102/44 70 98.6 F (37 C) (Oral) 18 92%  Intake/Output Summary (Last 24 hours) at 02/07/14 0802 Last data filed at 02/06/14 1851  Gross per 24 hour  Intake    600 ml  Output   2900 ml  Net  -2300 ml    Right antecubital site palpable thrill, incision C/D/I Right hand N/V/M intact no symptom of steal.   Assessment/Planning: Ultrasound-guided insertion of Diatek catheter right internal jugular vein, Right brachial cephalic AV fistula F/U in 4 weeks with Dr. Santiago Glad Narda [name] [name] 02/07/2014 8:02 AM -- No significant drainage + thrill Follow up 1 month Will sign off.  Call if questions  Fabienne Bruns, MD [facility] Office: [phone] Pager: [phone]  Laboratory Lab Results:  Recent Labs  02/06/14 0243 02/07/14 0455  WBC 6.4 7.3  HGB 8.3* 9.0*  HCT 27.2* 29.2*  PLT 156 145*   BMET  Recent Labs  02/06/14 0243 02/07/14 0455  NA 138 138  K 4.6 4.6  CL 99 98  CO2 29 25  GLUCOSE 108* 143*  BUN 42* 35*  CREATININE 2.24* 2.23*  CALCIUM 8.8 8.9    COAG Lab Results  Component Value Date   INR 1.13 02/05/2014   No results found for this basename: PTT

## 2014-02-07 NOTE — Progress Notes (Signed)
Patient ID: [name] [name], male   DOB: 09/10/1941, 73 y.o.   MRN: [id number]   HD yesterday, tolerated well.  Oxygen saturation remains low on room air.  Weight down from admission.   ECHO EF 15-20%.    Scheduled Meds: . apixaban  2.5 mg Oral BID  . atorvastatin  80 mg Oral q1800  . bisoprolol  2.5 mg Oral Daily  . budesonide-formoterol  2 puff Inhalation Daily  . [START ON 02/08/2014] darbepoetin (ARANESP) injection - DIALYSIS  100 mcg Subcutaneous Q Fri-HD  . docusate sodium  100 mg Oral Daily  . insulin aspart  0-9 Units Subcutaneous TID WC  . insulin glargine  10 Units Subcutaneous QHS  . ipratropium-albuterol  3 mL Nebulization TID  . levothyroxine  100 mcg Oral QAC breakfast  . multivitamin  1 tablet Oral QHS   Continuous Infusions: . sodium chloride 5 mL/hr at 02/05/14 1900  . milrinone Stopped (02/01/14 0800)   PRN Meds:.sodium chloride, sodium chloride, albuterol, ALPRAZolam, feeding supplement (NEPRO CARB STEADY), heparin, HYDROcodone-acetaminophen, HYDROmorphone (DILAUDID) injection, lidocaine (PF), lidocaine-prilocaine, ondansetron (ZOFRAN) IV, ondansetron, pentafluoroprop-tetrafluoroeth    Filed Vitals:   02/06/14 2100 02/07/14 0057 02/07/14 0430 02/07/14 0556  BP: 101/61   102/44  Pulse: 70 65  70  Temp: 98.7 F (37.1 C)   98.6 F (37 C)  TempSrc: Oral   Oral  Resp: 18 18  18   Height:      Weight:   212 lb 1.3 oz (96.2 kg)   SpO2: 92% 93%  92%    Intake/Output Summary (Last 24 hours) at 02/07/14 0840 Last data filed at 02/06/14 1851  Gross per 24 hour  Intake    600 ml  Output   2900 ml  Net  -2300 ml    LABS: Basic Metabolic Panel:  Recent Labs  40/98/1104/03/18 0243 02/07/14 0455  NA 138 138  K 4.6 4.6  CL 99 98  CO2 29 25  GLUCOSE 108* 143*  BUN 42* 35*  CREATININE 2.24* 2.23*  CALCIUM 8.8 8.9   Liver Function Tests: No results found for this basename: AST, ALT, ALKPHOS, BILITOT, PROT, ALBUMIN,  in the last 72 hours No results found for this  basename: LIPASE, AMYLASE,  in the last 72 hours CBC:  Recent Labs  02/06/14 0243 02/07/14 0455  WBC 6.4 7.3  HGB 8.3* 9.0*  HCT 27.2* 29.2*  MCV 96.5 95.4  PLT 156 145*   Cardiac Enzymes: No results found for this basename: CKTOTAL, CKMB, CKMBINDEX, TROPONINI,  in the last 72 hours BNP: No components found with this basename: POCBNP,  D-Dimer: No results found for this basename: DDIMER,  in the last 72 hours Hemoglobin A1C: No results found for this basename: HGBA1C,  in the last 72 hours Fasting Lipid Panel: No results found for this basename: CHOL, HDL, LDLCALC, TRIG, CHOLHDL, LDLDIRECT,  in the last 72 hours Thyroid Function Tests: No results found for this basename: TSH, T4TOTAL, FREET3, T3FREE, THYROIDAB,  in the last 72 hours Anemia Panel: No results found for this basename: VITAMINB12, FOLATE, FERRITIN, TIBC, IRON, RETICCTPCT,  in the last 72 hours  RADIOLOGY: No results found.  PHYSICAL EXAM General: NAD. Sitting on the side of the bed.  Neck: JVP to jaw, no thyromegaly or thyroid nodule. RIJ Lungs: Crackles at bases bilaterally. CV: Nondisplaced PMI.  Heart regular S1/S2, no S3/S4, no murmur. 1+ edema to thighs.  No carotid bruit.  Normal pedal pulses.  Abdomen: Soft, nontender, no hepatosplenomegaly, no distention.  Neurologic: Alert and oriented x 3.  Psych: Normal affect. Extremities: No clubbing or cyanosis.   TELEMETRY: Reviewed telemetry pt in underlying atrial fibrillation with v-pacing  ASSESSMENT AND PLAN: 1. 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 He has a CRT-D device. He remains volume overloaded on exam. Started on CVVHD 4/30 and stopped 5/3. He had his first HD on 5/6.  - Plan HD again today for fluid removal.  - Off hydralazine/isordil with initiation of HD.  - If possible, continue low dose bisoprolol but can stop if BP at HD becomes an issue.   2. Atrial fibrillation: Appears to be in underlying atrial fibrillation with BiV pacing. I spoke with  him about going on warfarin on hemodialysis instead of Eliquis.  He is very adamant that he does not want to take warfarin.  Eliquis 2.5 mg bid is an option for patients on hemodialysis though not studied well.  He is now on Eliquis 2.5 mg bid.  3. AKI on CKD: Suspect cardiorenal syndrome probably superimposed on diabetic nephropathy. Now getting HD. 4. CAD: Continue statin. 5. Disposition: Will need PT evaluation.  Will likely need home oxygen.  Probably home by end of week, will depend on how things go with HD.    Laurey Morale 02/07/2014

## 2014-02-07 NOTE — Progress Notes (Signed)
1. 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 CHF EF 20 %;CAD, BiV ICD  3. Anemia started aranesp  4 S/P RUE AVF  5 AFIB  Plan: HD today again   Subjective: Interval History: -3500 cc with dialysis  Objective: Vital signs in last 24 hours: Temp:  [97.7 F (36.5 C)-98.7 F (37.1 C)] 98.6 F (37 C) (05/07 0556) Pulse Rate:  [65-71] 70 (05/07 0556) Resp:  [16-18] 18 (05/07 0556) BP: (95-130)/(40-61) 102/44 mmHg (05/07 0556) SpO2:  [92 %-99 %] 92 % (05/07 0556) Weight:  [95.7 kg (210 lb 15.7 oz)-97.6 kg (215 lb 2.7 oz)] 96.2 kg (212 lb 1.3 oz) (05/07 0430) Weight change: 2.3 kg (5 lb 1.1 oz)  Intake/Output from previous day: 05/06 0701 - 05/07 0700 In: 621 [P.O.:600; I.V.:21] Out: 2900  Intake/Output this shift:    General appearance: alert and cooperative Chest wall: no tenderness Cardio: regular rate and rhythm, S1, S2 normal, no murmur, click, rub or gallop Extremities: edema 2-3+  Lab Results:  Recent Labs  02/06/14 0243 02/07/14 0455  WBC 6.4 7.3  HGB 8.3* 9.0*  HCT 27.2* 29.2*  PLT 156 145*   BMET:  Recent Labs  02/06/14 0243 02/07/14 0455  NA 138 138  K 4.6 4.6  CL 99 98  CO2 29 25  GLUCOSE 108* 143*  BUN 42* 35*  CREATININE 2.24* 2.23*  CALCIUM 8.8 8.9   No results found for this basename: PTH,  in the last 72 hours Iron Studies: No results found for this basename: IRON, TIBC, TRANSFERRIN, FERRITIN,  in the last 72 hours Studies/Results: No results found.  Scheduled: . apixaban  2.5 mg Oral BID  . atorvastatin  80 mg Oral q1800  . bisoprolol  2.5 mg Oral Daily  . budesonide-formoterol  2 puff Inhalation Daily  . [START ON 02/08/2014] darbepoetin (ARANESP) injection - DIALYSIS  100 mcg Subcutaneous Q Fri-HD  . docusate sodium  100 mg Oral Daily  . insulin aspart  0-9 Units Subcutaneous TID WC  . insulin glargine  10 Units Subcutaneous QHS  . ipratropium-albuterol  3 mL Nebulization  TID  . levothyroxine  100 mcg Oral QAC breakfast  . multivitamin  1 tablet Oral QHS     LOS: 9 days   Lauris Poag 02/07/2014,8:44 AM

## 2014-02-07 NOTE — Evaluation (Signed)
Physical Therapy Evaluation Patient Details Name: [name] [name] MRN: [id number] DOB: 11/25/1940 Today's Date: 02/07/2014   History of Present Illness  Pt adm with CHF and cardiorenal syndrome. Pt initiated HD on 02/06/14. Pt with hx of COPD, DM, HTN, HLD, CAD with pacemaker  Clinical Impression  Pt admitted with above. Pt currently with functional limitations due to the deficits listed below (see PT Problem List).  Pt will benefit from skilled PT to increase their independence and safety with mobility to allow discharge home with daughter. If daughter can't provide 24 hour assist pt may need ST-SNF.     Follow Up Recommendations Home health PT;Supervision/Assistance - 24 hour    Equipment Recommendations  None recommended by PT    Recommendations for Other Services       Precautions / Restrictions Precautions Precautions: Fall      Mobility  Bed Mobility                  Transfers Overall transfer level: Needs assistance Equipment used: Rolling walker (2 wheeled) Transfers: Sit to/from Stand Sit to Stand: Min assist         General transfer comment: Assist to bring hips up and for balance  Ambulation/Gait Ambulation/Gait assistance: Min guard Ambulation Distance (Feet): 100 Feet (x 2) Assistive device: Rolling walker (2 wheeled) Gait Pattern/deviations: Step-through pattern;Decreased step length - right;Decreased step length - left;Trunk flexed Gait velocity: decr Gait velocity interpretation: Below normal speed for age/gender General Gait Details: Verbal cues to stand more erect  Stairs            Wheelchair Mobility    Modified Rankin (Stroke Patients Only)       Balance Overall balance assessment: Needs assistance         Standing balance support: Bilateral upper extremity supported Standing balance-Leahy Scale: Poor Standing balance comment: walker for support                             Pertinent Vitals/Pain See flow  sheet. Pt with decr SaO2 (84%) with mobility with O2 at 3L.    Home Living Family/patient expects to be discharged to:: Private residence Living Arrangements: Children Available Help at Discharge: Family;Available 24 hours/day Type of Home: House Home Access: Level entry     Home Layout: One level Home Equipment: Walker - 2 wheels;Shower seat;Bedside commode      Prior Function Level of Independence: Independent with assistive device(s)               Hand Dominance        Extremity/Trunk Assessment   Upper Extremity Assessment: Generalized weakness           Lower Extremity Assessment: Generalized weakness         Communication   Communication: No difficulties  Cognition Arousal/Alertness: Awake/alert Behavior During Therapy: WFL for tasks assessed/performed Overall Cognitive Status: Within Functional Limits for tasks assessed                      General Comments      Exercises        Assessment/Plan    PT Assessment Patient needs continued PT services  PT Diagnosis Difficulty walking;Generalized weakness   PT Problem List Decreased strength;Decreased activity tolerance;Decreased balance;Decreased mobility;Decreased knowledge of use of DME  PT Treatment Interventions DME instruction;Gait training;Functional mobility training;Therapeutic activities;Therapeutic exercise;Balance training;Patient/family education   PT Goals (Current goals can be found in the  Care Plan section) Acute Rehab PT Goals Patient Stated Goal: Return home PT Goal Formulation: With patient Time For Goal Achievement: 02/14/14 Potential to Achieve Goals: Good    Frequency Min 3X/week   Barriers to discharge        Co-evaluation               End of Session Equipment Utilized During Treatment: Gait belt Activity Tolerance: Patient limited by fatigue Patient left: in chair;with call bell/phone within reach Nurse Communication: Mobility status          Time: [id number] PT Time Calculation (min): 15 min   Charges:   PT Evaluation $Initial PT Evaluation Tier I: 1 Procedure PT Treatments $Gait Training: 8-22 mins   PT G CodesAngelina Ok:          Vester Balthazor W Laelle Bridgett 02/07/2014, 2:11 PM  [employer] CorporationCary Keagen Heinlen PT [phone]

## 2014-02-08 LAB — CBC
HCT: 27.3 % — ABNORMAL LOW (ref 39.0–52.0)
Hemoglobin: 8.4 g/dL — ABNORMAL LOW (ref 13.0–17.0)
MCH: 29.2 pg (ref 26.0–34.0)
MCHC: 30.8 g/dL (ref 30.0–36.0)
MCV: 94.8 fL (ref 78.0–100.0)
Platelets: 137 10*3/uL — ABNORMAL LOW (ref 150–400)
RBC: 2.88 MIL/uL — ABNORMAL LOW (ref 4.22–5.81)
RDW: 16 % — ABNORMAL HIGH (ref 11.5–15.5)
WBC: 6.5 10*3/uL (ref 4.0–10.5)

## 2014-02-08 LAB — GLUCOSE, CAPILLARY
GLUCOSE-CAPILLARY: 176 mg/dL — AB (ref 70–99)
GLUCOSE-CAPILLARY: 172 mg/dL — AB (ref 70–99)
Glucose-Capillary: 154 mg/dL — ABNORMAL HIGH (ref 70–99)
Glucose-Capillary: 137 mg/dL — ABNORMAL HIGH (ref 70–99)

## 2014-02-08 LAB — BASIC METABOLIC PANEL
BUN: 24 mg/dL — AB (ref 6–23)
CO2: 31 mEq/L (ref 19–32)
CREATININE: 2.07 mg/dL — AB (ref 0.50–1.35)
Calcium: 8.8 mg/dL (ref 8.4–10.5)
Chloride: 97 mEq/L (ref 96–112)
GFR, EST AFRICAN AMERICAN: 35 mL/min — AB (ref >90)
GFR, EST NON AFRICAN AMERICAN: 30 mL/min — AB (ref >90)
Glucose, Bld: 129 mg/dL — ABNORMAL HIGH (ref 70–99)
Potassium: 4 mEq/L (ref 3.7–5.3)
Sodium: 139 mEq/L (ref 137–147)

## 2014-02-08 MED ORDER — DARBEPOETIN ALFA-POLYSORBATE 100 MCG/0.5ML IJ SOLN
INTRAMUSCULAR | Status: AC
  Administered 2014-02-08: 100 ug via SUBCUTANEOUS
  Filled 2014-02-08: qty 0.5

## 2014-02-08 MED ORDER — SODIUM CHLORIDE 0.9 % IV SOLN
125.0000 mg | Freq: Every day | INTRAVENOUS | Status: DC
  Administered 2014-02-08 – 2014-02-11 (×4): 125 mg via INTRAVENOUS
  Filled 2014-02-08 (×8): qty 10

## 2014-02-08 NOTE — Procedures (Signed)
On dialysis with low BP. He is asymptomatic.  Goal is 4000cc. ACpowell

## 2014-02-08 NOTE — Progress Notes (Signed)
Patient ID: [name] [name], male   DOB: 10/22/1940, 72 y.o.   MRN: [id number]   HD yesterday, tolerated well.  Oxygen saturation remains low on room air.  Weight continues to fall.  ECHO EF 15-20%.    Scheduled Meds: . apixaban  2.5 mg Oral BID  . atorvastatin  80 mg Oral q1800  . bisoprolol  2.5 mg Oral Daily  . budesonide-formoterol  2 puff Inhalation Daily  . darbepoetin (ARANESP) injection - DIALYSIS  100 mcg Subcutaneous Q Fri-HD  . docusate sodium  100 mg Oral Daily  . ferric gluconate (FERRLECIT/NULECIT) IV  125 mg Intravenous Daily  . insulin aspart  0-9 Units Subcutaneous TID WC  . insulin glargine  10 Units Subcutaneous QHS  . ipratropium-albuterol  3 mL Nebulization TID  . levothyroxine  100 mcg Oral QAC breakfast  . multivitamin  1 tablet Oral QHS   Continuous Infusions: . sodium chloride 5 mL/hr at 02/05/14 1900  . milrinone Stopped (02/01/14 0800)   PRN Meds:.sodium chloride, sodium chloride, albuterol, ALPRAZolam, feeding supplement (NEPRO CARB STEADY), heparin, HYDROcodone-acetaminophen, HYDROmorphone (DILAUDID) injection, lidocaine (PF), lidocaine-prilocaine, ondansetron (ZOFRAN) IV, ondansetron, pentafluoroprop-tetrafluoroeth    Filed Vitals:   02/07/14 2230 02/07/14 2300 02/07/14 2319 02/08/14 0614  BP: 112/52 109/53 90/55 90/52   Pulse: 70 70 77 70  Temp:   98.2 F (36.8 C) 98.2 F (36.8 C)  TempSrc:   Oral Oral  Resp:   20 16  Height:      Weight:   102.2 kg (225 lb 5 oz) 93.3 kg (205 lb 11 oz)  SpO2:   99% 92%    Intake/Output Summary (Last 24 hours) at 02/08/14 0916 Last data filed at 02/08/14 0000  Gross per 24 hour  Intake    600 ml  Output   4025 ml  Net  -3425 ml    LABS: Basic Metabolic Panel:  Recent Labs  82/64/15 1925 02/08/14 0500  NA 135* 139  K 4.4 4.0  CL 95* 97  CO2 27 31  GLUCOSE 169* 129*  BUN 45* 24*  CREATININE 2.92* 2.07*  CALCIUM 8.8 8.8  PHOS 4.4  --    Liver Function Tests:  Recent Labs  02/07/14 1925    ALBUMIN 3.0*   No results found for this basename: LIPASE, AMYLASE,  in the last 72 hours CBC:  Recent Labs  02/07/14 1925 02/08/14 0500  WBC 6.0 6.5  HGB 8.2* 8.4*  HCT 26.3* 27.3*  MCV 95.3 94.8  PLT 150 137*   Cardiac Enzymes: No results found for this basename: CKTOTAL, CKMB, CKMBINDEX, TROPONINI,  in the last 72 hours BNP: No components found with this basename: POCBNP,  D-Dimer: No results found for this basename: DDIMER,  in the last 72 hours Hemoglobin A1C: No results found for this basename: HGBA1C,  in the last 72 hours Fasting Lipid Panel: No results found for this basename: CHOL, HDL, LDLCALC, TRIG, CHOLHDL, LDLDIRECT,  in the last 72 hours Thyroid Function Tests: No results found for this basename: TSH, T4TOTAL, FREET3, T3FREE, THYROIDAB,  in the last 72 hours Anemia Panel: No results found for this basename: VITAMINB12, FOLATE, FERRITIN, TIBC, IRON, RETICCTPCT,  in the last 72 hours  RADIOLOGY: No results found.  PHYSICAL EXAM General: NAD. Sitting on the side of the bed.  Neck: JVP to jaw, no thyromegaly or thyroid nodule. RIJ Lungs: Crackles at bases bilaterally. CV: Nondisplaced PMI.  Heart regular S1/S2, no S3/S4, no murmur. 1+ edema to thighs.  No carotid bruit.  Normal pedal pulses.  Abdomen: Soft, nontender, no hepatosplenomegaly, no distention.  Neurologic: Alert and oriented x 3.  Psych: Normal affect. Extremities: No clubbing or cyanosis.   TELEMETRY: Reviewed telemetry pt in underlying atrial fibrillation with v-pacing  ASSESSMENT AND PLAN: 1. 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 He has a CRT-D device. He remains volume overloaded on exam. Started on CVVHD 4/30 and stopped 5/3. He had his first HD on 5/6. Now getting daily HD for fluid removal. - Plan HD again today for fluid removal. Still has more fluid to get off.  - Off hydralazine/isordil with initiation of HD.  - If possible, continue low dose bisoprolol but can stop if BP at HD  becomes an issue.   2. Atrial fibrillation: Appears to be in underlying atrial fibrillation with BiV pacing. I spoke with him about going on warfarin on hemodialysis instead of Eliquis.  He is very adamant that he does not want to take warfarin.  Eliquis 2.5 mg bid is an option for patients on hemodialysis though not studied well.  He is now on Eliquis 2.5 mg bid.  3. AKI on CKD: Suspect cardiorenal syndrome probably superimposed on diabetic nephropathy. Now getting HD. 4. CAD: Continue statin. 5. Disposition: Will need PT evaluation.  Will likely need home oxygen.  Probably home by end of week, will depend on how things go with HD.    [name] 02/08/2014

## 2014-02-08 NOTE — Progress Notes (Signed)
1. ESRD due to diabetes, new start  2. CHF EF 20 %;CAD, BiV ICD  3. Anemia started aranesp  4 S/P RUE AVF  5 AFIB  Plan: HD today again.  [facility]  Will shoot for discharge Monday if volume allows.   Subjective: Interval History: Encouraged by improvement.  BP remains an issue on HD  Objective: Vital signs in last 24 hours: Temp:  [97.7 F (36.5 C)-98.2 F (36.8 C)] 98.2 F (36.8 C) (05/08 7356) Pulse Rate:  [65-77] 70 (05/08 0614) Resp:  [16-20] 16 (05/08 0614) BP: (90-112)/(45-55) 90/52 mmHg (05/08 0614) SpO2:  [82 %-99 %] 92 % (05/08 0614) Weight:  [93.3 kg (205 lb 11 oz)-106.7 kg (235 lb 3.7 oz)] 93.3 kg (205 lb 11 oz) (05/08 7014) Weight change: 9.1 kg (20 lb 1 oz)  Intake/Output from previous day: 05/07 0701 - 05/08 0700 In: 720 [P.O.:720] Out: 4025  Intake/Output this shift:    General appearance: alert and cooperative Resp: clear to auscultation bilaterally and diminished at bases Cardio: RR rhythm Extremities: edema 3+  Lab Results:  Recent Labs  02/07/14 1925 02/08/14 0500  WBC 6.0 6.5  HGB 8.2* 8.4*  HCT 26.3* 27.3*  PLT 150 137*   BMET:  Recent Labs  02/07/14 1925 02/08/14 0500  NA 135* 139  K 4.4 4.0  CL 95* 97  CO2 27 31  GLUCOSE 169* 129*  BUN 45* 24*  CREATININE 2.92* 2.07*  CALCIUM 8.8 8.8   No results found for this basename: PTH,  in the last 72 hours Iron Studies: No results found for this basename: IRON, TIBC, TRANSFERRIN, FERRITIN,  in the last 72 hours Studies/Results: No results found.  Scheduled: . apixaban  2.5 mg Oral BID  . atorvastatin  80 mg Oral q1800  . bisoprolol  2.5 mg Oral Daily  . budesonide-formoterol  2 puff Inhalation Daily  . darbepoetin (ARANESP) injection - DIALYSIS  100 mcg Subcutaneous Q Fri-HD  . docusate sodium  100 mg Oral Daily  . ferric gluconate (FERRLECIT/NULECIT) IV  125 mg Intravenous Daily  . insulin aspart  0-9 Units Subcutaneous TID WC  . insulin glargine   10 Units Subcutaneous QHS  . ipratropium-albuterol  3 mL Nebulization TID  . levothyroxine  100 mcg Oral QAC breakfast  . multivitamin  1 tablet Oral QHS     LOS: 10 days   Lauris Poag 02/08/2014,11:22 AM

## 2014-02-08 NOTE — Progress Notes (Signed)
CARDIAC REHAB PHASE I   PRE:  Rate/Rhythm: 70 paced  BP:  Supine:   Sitting: 100/44  Standing:    SaO2: 87%RA, 94%2L  MODE:  Ambulation: 308 ft   POST:  Rate/Rhythm: 70 paced  BP:  Supine:   Sitting: 98/50  Standing:    SaO2: 92-93% 4L hall and room [phone] Pt walked 308 ft on 4L with gait belt use, rolling walker and asst x 2. One asst to follow with wheelchair. Pt sat at 120 ft and again about 250 ft. Encouraged pt to take slower more controlled steps and trying to conserve energy. Took sats twice during walk and stable on 4L.  To recliner after walk. Left on 2L. Call bell in reach. Keep as asst x 2.   Luetta Nutting, RN BSN  02/08/2014 9:07 AM

## 2014-02-09 LAB — BASIC METABOLIC PANEL
BUN: 23 mg/dL (ref 6–23)
CHLORIDE: 99 meq/L (ref 96–112)
CO2: 29 mEq/L (ref 19–32)
Calcium: 8.6 mg/dL (ref 8.4–10.5)
Creatinine, Ser: 2.2 mg/dL — ABNORMAL HIGH (ref 0.50–1.35)
GFR calc Af Amer: 32 mL/min — ABNORMAL LOW (ref >90)
GFR calc non Af Amer: 28 mL/min — ABNORMAL LOW (ref >90)
Glucose, Bld: 99 mg/dL (ref 70–99)
POTASSIUM: 4.1 meq/L (ref 3.7–5.3)
SODIUM: 140 meq/L (ref 137–147)

## 2014-02-09 LAB — GLUCOSE, CAPILLARY
GLUCOSE-CAPILLARY: 128 mg/dL — AB (ref 70–99)
GLUCOSE-CAPILLARY: 182 mg/dL — AB (ref 70–99)
GLUCOSE-CAPILLARY: 138 mg/dL — AB (ref 70–99)
Glucose-Capillary: 137 mg/dL — ABNORMAL HIGH (ref 70–99)

## 2014-02-09 NOTE — Progress Notes (Signed)
02/09/2014 11:54 AM Hemodialysis Outpatient Note; this patient has been accepted at the [facility] on a Tuesday, Thursday and Saturday 2nd shift schedule. The center will begin treatment on Tuesday May 12,2015. Patient needs to arrive at the center for 11:00 to sign paperwork and consents. Thank you. [name] [name]

## 2014-02-09 NOTE — Progress Notes (Signed)
1. ESRD due to diabetes, new start  2. CHF EF 20 %;CAD, BiV ICD  3. Anemia started aranesp  4 S/P RUE AVF  5 AFIB  Plan: HD today again and Sunday. [facility] Will shoot for discharge Monday if volume allows.   Subjective: Interval History: Still using oxygen, weigh 91.4 kg today, 103.9 Kg on 4/28  Objective: Vital signs in last 24 hours: Temp:  [97.4 F (36.3 C)-98 F (36.7 C)] 97.6 F (36.4 C) (05/09 0416) Pulse Rate:  [69-80] 69 (05/09 0416) Resp:  [16-20] 16 (05/09 0416) BP: (83-99)/(45-59) 95/45 mmHg (05/09 0416) SpO2:  [96 %-100 %] 100 % (05/09 0416) Weight:  [89.9 kg (198 lb 3.1 oz)-93.5 kg (206 lb 2.1 oz)] 91.491 kg (201 lb 11.2 oz) (05/09 0416) Weight change: -13.2 kg (-29 lb 1.6 oz)  Intake/Output from previous day: 05/08 0701 - 05/09 0700 In: 240 [P.O.:240] Out: 3328  Intake/Output this shift:    General appearance: alert and cooperative Resp: diminished breath sounds bibasilar Chest wall: no tenderness Cardio: regular rate and rhythm, S1, S2 normal, no murmur, click, rub or gallop Extremities: edema 3+ but less  Lab Results:  Recent Labs  02/07/14 1925 02/08/14 0500  WBC 6.0 6.5  HGB 8.2* 8.4*  HCT 26.3* 27.3*  PLT 150 137*   BMET:  Recent Labs  02/08/14 0500 02/09/14 0431  NA 139 140  K 4.0 4.1  CL 97 99  CO2 31 29  GLUCOSE 129* 99  BUN 24* 23  CREATININE 2.07* 2.20*  CALCIUM 8.8 8.6   No results found for this basename: PTH,  in the last 72 hours Iron Studies: No results found for this basename: IRON, TIBC, TRANSFERRIN, FERRITIN,  in the last 72 hours Studies/Results: No results found.  Scheduled: . apixaban  2.5 mg Oral BID  . atorvastatin  80 mg Oral q1800  . bisoprolol  2.5 mg Oral Daily  . budesonide-formoterol  2 puff Inhalation Daily  . darbepoetin (ARANESP) injection - DIALYSIS  100 mcg Subcutaneous Q Fri-HD  . docusate sodium  100 mg Oral Daily  . ferric gluconate (FERRLECIT/NULECIT) IV  125  mg Intravenous Daily  . insulin aspart  0-9 Units Subcutaneous TID WC  . insulin glargine  10 Units Subcutaneous QHS  . ipratropium-albuterol  3 mL Nebulization TID  . levothyroxine  100 mcg Oral QAC breakfast  . multivitamin  1 tablet Oral QHS     LOS: 11 days   Lauris Poag 02/09/2014,7:22 AM

## 2014-02-09 NOTE — Progress Notes (Signed)
Patient ID: [name] [name], male   DOB: 12/07/1940, 73 y.o.   MRN: [id number]    SUBJECTIVE:  The patient is feeling better. He hopes to get home soon. He knows that the working plan is for discharge on Monday. Dialysis continues to be effective in removing fluid. His rhythm is stable.   Filed Vitals:   02/08/14 1954 02/08/14 2214 02/09/14 0416 02/09/14 0755  BP: 97/51  95/45   Pulse: 70 80 69   Temp: 98 F (36.7 C)  97.6 F (36.4 C)   TempSrc: Oral  Oral   Resp: 20 20 16    Height:      Weight:   201 lb 11.2 oz (91.491 kg)   SpO2: 98%  100% 96%     Intake/Output Summary (Last 24 hours) at 02/09/14 0830 Last data filed at 02/08/14 1824  Gross per 24 hour  Intake    240 ml  Output   3328 ml  Net  -3088 ml    LABS: Basic Metabolic Panel:  Recent Labs  42/59/56 1925 02/08/14 0500 02/09/14 0431  NA 135* 139 140  K 4.4 4.0 4.1  CL 95* 97 99  CO2 27 31 29   GLUCOSE 169* 129* 99  BUN 45* 24* 23  CREATININE 2.92* 2.07* 2.20*  CALCIUM 8.8 8.8 8.6  PHOS 4.4  --   --    Liver Function Tests:  Recent Labs  02/07/14 1925  ALBUMIN 3.0*   No results found for this basename: LIPASE, AMYLASE,  in the last 72 hours CBC:  Recent Labs  02/07/14 1925 02/08/14 0500  WBC 6.0 6.5  HGB 8.2* 8.4*  HCT 26.3* 27.3*  MCV 95.3 94.8  PLT 150 137*   Cardiac Enzymes: No results found for this basename: CKTOTAL, CKMB, CKMBINDEX, TROPONINI,  in the last 72 hours BNP: No components found with this basename: POCBNP,  D-Dimer: No results found for this basename: DDIMER,  in the last 72 hours Hemoglobin A1C: No results found for this basename: HGBA1C,  in the last 72 hours Fasting Lipid Panel: No results found for this basename: CHOL, HDL, LDLCALC, TRIG, CHOLHDL, LDLDIRECT,  in the last 72 hours Thyroid Function Tests: No results found for this basename: TSH, T4TOTAL, FREET3, T3FREE, THYROIDAB,  in the last 72 hours  RADIOLOGY: US Renal  02/03/2014   CLINICAL DATA:  Acute renal  insufficiency.  Evaluate for obstruction  EXAM: RENAL/URINARY TRACT ULTRASOUND COMPLETE  COMPARISON:  11/15/2013  FINDINGS: Right Kidney:  Length: 11.4 cm. Echogenicity within normal limits. No mass or hydronephrosis visualized.  Left Kidney:  Length: 12.4 cm. Echogenicity within normal limits. No mass or hydronephrosis visualized.  Bladder:  Collapsed around a Foley catheter.  Other:  Left pleural effusion and mild ascites identified  IMPRESSION: 1. No obstructive uropathy.   Electronically Signed   By: Signa Kell M.D.   On: 02/03/2014 11:31   Dg Chest Port 1 View  02/04/2014   CLINICAL DATA:  Status post dialysis catheter placement.  EXAM: PORTABLE CHEST - 1 VIEW  COMPARISON:  01/31/2014  FINDINGS: A dialysis catheter has been placed. Its tip to difficult to visualize, secondary to overlying pacer. Likely low SVC and high right atrium.  No pneumothorax. Cardiomegaly accentuated by AP portable technique. Small left pleural effusion. Decreased lung volumes with similar to increased moderate congestive failure. Improved right and persistent left base airspace disease.  IMPRESSION: Dialysis catheter with tips which are difficult to visualize. No pneumothorax.  Congestive heart failure with persistent small  left pleural effusion and adjacent atelectasis or infection.   Electronically Signed   By: Jeronimo GreavesKyle  Talbot M.D.   On: 02/04/2014 17:28   Dg Chest Port 1 View  01/31/2014   CLINICAL DATA:  Status post HDcatheter placement.  EXAM: PORTABLE CHEST - 1 VIEW  COMPARISON:  DG CHEST PORTABLE dated 01/29/2014  FINDINGS: The lungs are adequately inflated. The hemidiaphragms are obscured. The cardiopericardial silhouette is enlarged. The pulmonary vascularity is engorged in the interstitial markings are increased. These findings are not greatly changed since the study of 28 April. The patient has undergone interval placement of a right internal jugular venous catheter. The tip lies in the region of the junction of the  right and left brachiocephalic veins. There is no evidence of a post procedure pneumothorax. A permanent pacemaker defibrillator is in place and appears unchanged.  IMPRESSION: 1. The venous catheter placed via the right internal jugular approach has its tip at the junction of the right and left brachiocephalic veins. There is no evidence of immediate post procedure complication. 2. The findings are consistent with congestive heart failure with pulmonary interstitial edema and bilateral pleural effusions.   Electronically Signed   By: David  [place]   On: 01/31/2014 20:50   Dg Fluoro Guide Cv Line-no Report  02/04/2014   CLINICAL DATA: diatek catheter insertion   FLOURO GUIDE CV LINE  Fluoroscopy was utilized by the requesting physician.  No radiographic  interpretation.     PHYSICAL EXAM  patient is oriented to person time and place. Affect is normal. Head is atraumatic. Sclera and conjunctiva are normal. There is no jugular venous distention. Lungs reveal scattered rhonchi. Cardiac exam reveals S1 and S2. Abdomen is soft. The patient still has 2+ peripheral edema.   TELEMETRY: I reviewed telemetry today Feb 09, 2014. The rhythm is paced.   ASSESSMENT AND PLAN:    Acute respiratory failure with hypoxia     This is improving with volume treatment.      CHF (congestive heart failure), NYHA class IV       This is being successfully treated with dialysis.    CAD (coronary artery disease) of artery bypass graft   CAD is stable.    AF (atrial fibrillation)    By history he has underlying atrial fib. Today he is pacing. He is anticoagulated    Automatic implantable cardiac defibrillator in situ  As outlined by the nephrology team, hopefully he will be ready to go home on Monday.  Luis AbedJeffrey D Abrey Bradway 02/09/2014 8:30 AM

## 2014-02-10 LAB — GLUCOSE, CAPILLARY
GLUCOSE-CAPILLARY: 100 mg/dL — AB (ref 70–99)
GLUCOSE-CAPILLARY: 101 mg/dL — AB (ref 70–99)
Glucose-Capillary: 143 mg/dL — ABNORMAL HIGH (ref 70–99)

## 2014-02-10 LAB — BASIC METABOLIC PANEL
BUN: 27 mg/dL — ABNORMAL HIGH (ref 6–23)
CHLORIDE: 93 meq/L — AB (ref 96–112)
CO2: 28 meq/L (ref 19–32)
Calcium: 8.8 mg/dL (ref 8.4–10.5)
Creatinine, Ser: 2.4 mg/dL — ABNORMAL HIGH (ref 0.50–1.35)
GFR calc non Af Amer: 25 mL/min — ABNORMAL LOW (ref >90)
GFR, EST AFRICAN AMERICAN: 29 mL/min — AB (ref >90)
Glucose, Bld: 108 mg/dL — ABNORMAL HIGH (ref 70–99)
POTASSIUM: 4.1 meq/L (ref 3.7–5.3)
SODIUM: 133 meq/L — AB (ref 137–147)

## 2014-02-10 MED ORDER — ALPRAZOLAM 0.5 MG PO TABS
ORAL_TABLET | ORAL | Status: AC
  Filled 2014-02-10: qty 1

## 2014-02-10 NOTE — Progress Notes (Signed)
Patient ID: [name] [name], male   DOB: 11/25/1940, 73 y.o.   MRN: [id number]    SUBJECTIVE:  Patient is comfortable. He returned from dialysis. Dialysis was able to remove a significant amount of fluid.   Filed Vitals:   02/10/14 1151 02/10/14 1234 02/10/14 1243 02/10/14 1251  BP: 125/53 114/42 88/37 93/37   Pulse: 70 70 70 70  Temp: 98.4 F (36.9 C)     TempSrc: Oral     Resp: 18   20  Height:      Weight: 215 lb 2.7 oz (97.6 kg)     SpO2: 98% 98% 96% 99%     Intake/Output Summary (Last 24 hours) at 02/10/14 1531 Last data filed at 02/10/14 1151  Gross per 24 hour  Intake      0 ml  Output   5857 ml  Net  -5857 ml    LABS: Basic Metabolic Panel:  Recent Labs  65/78/4604/04/17 1925  02/09/14 0431 02/10/14 0347  NA 135*  < > 140 133*  K 4.4  < > 4.1 4.1  CL 95*  < > 99 93*  CO2 27  < > 29 28  GLUCOSE 169*  < > 99 108*  BUN 45*  < > 23 27*  CREATININE 2.92*  < > 2.20* 2.40*  CALCIUM 8.8  < > 8.6 8.8  PHOS 4.4  --   --   --   < > = values in this interval not displayed. Liver Function Tests:  Recent Labs  02/07/14 1925  ALBUMIN 3.0*   No results found for this basename: LIPASE, AMYLASE,  in the last 72 hours CBC:  Recent Labs  02/07/14 1925 02/08/14 0500  WBC 6.0 6.5  HGB 8.2* 8.4*  HCT 26.3* 27.3*  MCV 95.3 94.8  PLT 150 137*   Cardiac Enzymes: No results found for this basename: CKTOTAL, CKMB, CKMBINDEX, TROPONINI,  in the last 72 hours BNP: No components found with this basename: POCBNP,  D-Dimer: No results found for this basename: DDIMER,  in the last 72 hours Hemoglobin A1C: No results found for this basename: HGBA1C,  in the last 72 hours Fasting Lipid Panel: No results found for this basename: CHOL, HDL, LDLCALC, TRIG, CHOLHDL, LDLDIRECT,  in the last 72 hours Thyroid Function Tests: No results found for this basename: TSH, T4TOTAL, FREET3, T3FREE, THYROIDAB,  in the last 72 hours  RADIOLOGY: [place] Renal  02/03/2014   CLINICAL DATA:  Acute  renal insufficiency.  Evaluate for obstruction  EXAM: RENAL/URINARY TRACT ULTRASOUND COMPLETE  COMPARISON:  11/15/2013  FINDINGS: Right Kidney:  Length: 11.4 cm. Echogenicity within normal limits. No mass or hydronephrosis visualized.  Left Kidney:  Length: 12.4 cm. Echogenicity within normal limits. No mass or hydronephrosis visualized.  Bladder:  Collapsed around a Foley catheter.  Other:  Left pleural effusion and mild ascites identified  IMPRESSION: 1. No obstructive uropathy.   Electronically Signed   By: Signa Kellaylor  Stroud M.D.   On: 02/03/2014 11:31   Dg Chest Port 1 View  02/04/2014   CLINICAL DATA:  Status post dialysis catheter placement.  EXAM: PORTABLE CHEST - 1 VIEW  COMPARISON:  01/31/2014  FINDINGS: A dialysis catheter has been placed. Its tip to difficult to visualize, secondary to overlying pacer. Likely low SVC and high right atrium.  No pneumothorax. Cardiomegaly accentuated by AP portable technique. Small left pleural effusion. Decreased lung volumes with similar to increased moderate congestive failure. Improved right and persistent left base airspace disease.  IMPRESSION:  Dialysis catheter with tips which are difficult to visualize. No pneumothorax.  Congestive heart failure with persistent small left pleural effusion and adjacent atelectasis or infection.   Electronically Signed   By: Jeronimo Greaves M.D.   On: 02/04/2014 17:28   Dg Chest Port 1 View  01/31/2014   CLINICAL DATA:  Status post HDcatheter placement.  EXAM: PORTABLE CHEST - 1 VIEW  COMPARISON:  DG CHEST PORTABLE dated 01/29/2014  FINDINGS: The lungs are adequately inflated. The hemidiaphragms are obscured. The cardiopericardial silhouette is enlarged. The pulmonary vascularity is engorged in the interstitial markings are increased. These findings are not greatly changed since the study of 28 April. The patient has undergone interval placement of a right internal jugular venous catheter. The tip lies in the region of the junction of  the right and left brachiocephalic veins. There is no evidence of a post procedure pneumothorax. A permanent pacemaker defibrillator is in place and appears unchanged.  IMPRESSION: 1. The venous catheter placed via the right internal jugular approach has its tip at the junction of the right and left brachiocephalic veins. There is no evidence of immediate post procedure complication. 2. The findings are consistent with congestive heart failure with pulmonary interstitial edema and bilateral pleural effusions.   Electronically Signed   By: David  [place]   On: 01/31/2014 20:50   Dg Fluoro Guide Cv Line-no Report  02/04/2014   CLINICAL DATA: diatek catheter insertion   FLOURO GUIDE CV LINE  Fluoroscopy was utilized by the requesting physician.  No radiographic  interpretation.     PHYSICAL EXAM patient is resting comfortably. He is flat in bed. Lungs reveal scattered rhonchi. Cardiac exam reveals S1 and S2. The abdomen is soft. There is decreased peripheral edema.   TELEMETRY: I have reviewed telemetry today Feb 10, 2014. The rhythm is paced.   ASSESSMENT AND PLAN:     Acute respiratory failure with hypoxia    Improved    CHF (congestive heart failure), NYHA class IV     Improved  I am hopeful that patient will be able to go home tomorrow. This decision can be made tomorrow by nephrology and our team.   Luis Abed 02/10/2014 3:31 PM

## 2014-02-10 NOTE — Progress Notes (Signed)
RN attempted to restart outdated IV.  Patient refused.  Patient educated on hospital policy for IV restarts.  Patient continue to refuse restart.  Patient current IV site patent.  RN will continue to monitor

## 2014-02-11 LAB — BASIC METABOLIC PANEL
BUN: 31 mg/dL — AB (ref 6–23)
CALCIUM: 9.5 mg/dL (ref 8.4–10.5)
CHLORIDE: 93 meq/L — AB (ref 96–112)
CO2: 25 mEq/L (ref 19–32)
CREATININE: 2.88 mg/dL — AB (ref 0.50–1.35)
GFR calc non Af Amer: 20 mL/min — ABNORMAL LOW (ref >90)
GFR, EST AFRICAN AMERICAN: 23 mL/min — AB (ref >90)
Glucose, Bld: 107 mg/dL — ABNORMAL HIGH (ref 70–99)
Potassium: 4.1 mEq/L (ref 3.7–5.3)
Sodium: 135 mEq/L — ABNORMAL LOW (ref 137–147)

## 2014-02-11 LAB — GLUCOSE, CAPILLARY
GLUCOSE-CAPILLARY: 114 mg/dL — AB (ref 70–99)
GLUCOSE-CAPILLARY: 154 mg/dL — AB (ref 70–99)
Glucose-Capillary: 91 mg/dL (ref 70–99)

## 2014-02-11 MED ORDER — ALTEPLASE 2 MG IJ SOLR
2.0000 mg | Freq: Once | INTRAMUSCULAR | Status: DC | PRN
  Filled 2014-02-11: qty 2

## 2014-02-11 MED ORDER — SODIUM CHLORIDE 0.9 % IV SOLN: 100.0000 mL | INTRAVENOUS | Status: DC | PRN

## 2014-02-11 MED ORDER — HEPARIN SODIUM (PORCINE) 1000 UNIT/ML DIALYSIS: 1000.0000 [IU] | INTRAMUSCULAR | Status: DC | PRN

## 2014-02-11 MED ORDER — BISOPROLOL FUMARATE 5 MG PO TABS: 2.5000 mg | ORAL_TABLET | Freq: Every day | ORAL | Status: DC

## 2014-02-11 MED ORDER — HEPARIN SODIUM (PORCINE) 1000 UNIT/ML DIALYSIS
20.0000 [IU]/kg | INTRAMUSCULAR | Status: DC | PRN
  Filled 2014-02-11: qty 2

## 2014-02-11 MED ORDER — PENTAFLUOROPROP-TETRAFLUOROETH EX AERO: 1.0000 "application " | INHALATION_SPRAY | CUTANEOUS | Status: DC | PRN

## 2014-02-11 MED ORDER — NEPRO/CARBSTEADY PO LIQD: 237.0000 mL | ORAL | Status: DC | PRN

## 2014-02-11 MED ORDER — APIXABAN 2.5 MG PO TABS: 2.5000 mg | ORAL_TABLET | Freq: Two times a day (BID) | ORAL | Status: DC

## 2014-02-11 MED ORDER — RENA-VITE PO TABS: 1.0000 | ORAL_TABLET | Freq: Every day | ORAL | Status: DC

## 2014-02-11 MED ORDER — ALPRAZOLAM 0.5 MG PO TABS
ORAL_TABLET | ORAL | Status: AC
  Administered 2014-02-11: 0.25 mg via ORAL
  Filled 2014-02-11: qty 1

## 2014-02-11 MED ORDER — LIDOCAINE-PRILOCAINE 2.5-2.5 % EX CREA: 1.0000 "application " | TOPICAL_CREAM | CUTANEOUS | Status: DC | PRN

## 2014-02-11 MED ORDER — LIDOCAINE HCL (PF) 1 % IJ SOLN: 5.0000 mL | INTRAMUSCULAR | Status: DC | PRN

## 2014-02-11 NOTE — Procedures (Signed)
Pt seen on HD.  Ap 170.  Vp 170.  Tolerating HD well.  He knows to FU at [facility] tomorrow.

## 2014-02-11 NOTE — Discharge Instructions (Signed)
02/04/2014 [name] [name] [id number] 07/17/1941  Surgeon(s): Sherren Kernsharles E Fields, MD  Procedure(s): INSERTION OF DIALYSIS CATHETER Insertion of Right Arm arteriovenous fistula  x Do not stick fistula for 12 weeks     Information on my medicine - ELIQUIS (apixaban)  This medication education was reviewed with me or my healthcare representative as part of my discharge preparation.  The pharmacist that spoke with me during my hospital stay was:  Gardner CandleJessica C Myrene Bougher, [facility]  Why was Eliquis prescribed for you? Eliquis was prescribed for you to reduce the risk of forming blood clots that can cause a stroke if you have a medical condition called atrial fibrillation (a type of irregular heartbeat) OR to reduce the risk of a blood clots forming after orthopedic surgery.  What do You need to know about Eliquis ? Take your Eliquis TWICE DAILY - one tablet in the morning and one tablet in the evening with or without food.  It would be best to take the doses about the same time each day.  If you have difficulty swallowing the tablet whole please discuss with your pharmacist how to take the medication safely.  Take Eliquis exactly as prescribed by your doctor and DO NOT stop taking Eliquis without talking to the doctor who prescribed the medication.  Stopping may increase your risk of developing a new clot or stroke.  Refill your prescription before you run out.  After discharge, you should have regular check-up appointments with your healthcare provider that is prescribing your Eliquis.  In the future your dose may need to be changed if your kidney function or weight changes by a significant amount or as you get older.  What do you do if you miss a dose? If you miss a dose, take it as soon as you remember on the same day and resume taking twice daily.  Do not take more than one dose of ELIQUIS at the same time.  Important Safety Information A possible side effect of Eliquis is bleeding.  You should call your healthcare provider right away if you experience any of the following:   Bleeding from an injury or your nose that does not stop.   Unusual colored urine (red or dark brown) or unusual colored stools (red or black).   Unusual bruising for unknown reasons.   A serious fall or if you hit your head (even if there is no bleeding).  Some medicines may interact with Eliquis and might increase your risk of bleeding or clotting while on Eliquis. To help avoid this, consult your healthcare provider or pharmacist prior to using any new prescription or non-prescription medications, including herbals, vitamins, non-steroidal anti-inflammatory drugs (NSAIDs) and supplements.  This website has more information on Eliquis (apixaban): [email or web address].   Heart Failure Heart failure is a condition in which the heart has trouble pumping blood. This means your heart does not pump blood efficiently for your body to work well. In some cases of heart failure, fluid may back up into your lungs or you may have swelling (edema) in your lower legs. Heart failure is usually a long-term (chronic) condition. It is important for you to take good care of yourself and follow your caregiver's treatment plan. CAUSES  Some health conditions can cause heart failure. Those health conditions include:  High blood pressure (hypertension) causes the heart muscle to work harder than normal. When pressure in the blood vessels is high, the heart needs to pump (contract) with more force in order to circulate blood  throughout the body. High blood pressure eventually causes the heart to become stiff and weak.  Coronary artery disease (CAD) is the buildup of cholesterol and fat (plaque) in the arteries of the heart. The blockage in the arteries deprives the heart muscle of oxygen and blood. This can cause chest pain and may lead to a heart attack. High blood pressure can also contribute to CAD.  Heart attack (myocardial  infarction) occurs when 1 or more arteries in the heart become blocked. The loss of oxygen damages the muscle tissue of the heart. When this happens, part of the heart muscle dies. The injured tissue does not contract as well and weakens the heart's ability to pump blood.  Abnormal heart valves can cause heart failure when the heart valves do not open and close properly. This makes the heart muscle pump harder to keep the blood flowing.  Heart muscle disease (cardiomyopathy or myocarditis) is damage to the heart muscle from a variety of causes. These can include drug or alcohol abuse, infections, or unknown reasons. These can increase the risk of heart failure.  Lung disease makes the heart work harder because the lungs do not work properly. This can cause a strain on the heart, leading it to fail.  Diabetes increases the risk of heart failure. High blood sugar contributes to high fat (lipid) levels in the blood. Diabetes can also cause slow damage to tiny blood vessels that carry important nutrients to the heart muscle. When the heart does not get enough oxygen and food, it can cause the heart to become weak and stiff. This leads to a heart that does not contract efficiently.  Other conditions can contribute to heart failure. These include abnormal heart rhythms, thyroid problems, and low blood counts (anemia). Certain unhealthy behaviors can increase the risk of heart failure. Those unhealthy behaviors include:  Being overweight.  Smoking or chewing tobacco.  Eating foods high in fat and cholesterol.  Abusing illicit drugs or alcohol.  Lacking physical activity. SYMPTOMS  Heart failure symptoms may vary and can be hard to detect. Symptoms may include:  Shortness of breath with activity, such as climbing stairs.  Persistent cough.  Swelling of the feet, ankles, legs, or abdomen.  Unexplained weight gain.  Difficulty breathing when lying flat (orthopnea).  Waking from sleep because  of the need to sit up and get more air.  Rapid heartbeat.  Fatigue and loss of energy.  Feeling lightheaded, dizzy, or close to fainting.  Loss of appetite.  Nausea.  Increased urination during the night (nocturia). DIAGNOSIS  A diagnosis of heart failure is based on your history, symptoms, physical examination, and diagnostic tests. Diagnostic tests for heart failure may include:  Echocardiography.  Electrocardiography.  Chest X-ray.  Blood tests.  Exercise stress test.  Cardiac angiography.  Radionuclide scans. TREATMENT  Treatment is aimed at managing the symptoms of heart failure. Medicines, behavioral changes, or surgical intervention may be necessary to treat heart failure.  Medicines to help treat heart failure may include:  Angiotensin-converting enzyme (ACE) inhibitors. This type of medicine blocks the effects of a blood protein called angiotensin-converting enzyme. ACE inhibitors relax (dilate) the blood vessels and help lower blood pressure.  Angiotensin receptor blockers. This type of medicine blocks the actions of a blood protein called angiotensin. Angiotensin receptor blockers dilate the blood vessels and help lower blood pressure.  Water pills (diuretics). Diuretics cause the kidneys to remove salt and water from the blood. The extra fluid is removed through urination. This  loss of extra fluid lowers the volume of blood the heart pumps.  Beta blockers. These prevent the heart from beating too fast and improve heart muscle strength.  Digitalis. This increases the force of the heartbeat.  Healthy behavior changes include:  Obtaining and maintaining a healthy weight.  Stopping smoking or chewing tobacco.  Eating heart healthy foods.  Limiting or avoiding alcohol.  Stopping illicit drug use.  Physical activity as directed by your caregiver.  Surgical treatment for heart failure may include:  A procedure to open blocked arteries, repair damaged  heart valves, or remove damaged heart muscle tissue.  A pacemaker to improve heart muscle function and control certain abnormal heart rhythms.  An internal cardioverter defibrillator to treat certain serious abnormal heart rhythms.  A left ventricular assist device to assist the pumping ability of the heart. HOME CARE INSTRUCTIONS   Take your medicine as directed by your caregiver. Medicines are important in reducing the workload of your heart, slowing the progression of heart failure, and improving your symptoms.  Do not stop taking your medicine unless directed by your caregiver.  Do not skip any dose of medicine.  Refill your prescriptions before you run out of medicine. Your medicines are needed every day.  Take over-the-counter medicine only as directed by your caregiver or pharmacist.  Engage in moderate physical activity if directed by your caregiver. Moderate physical activity can benefit some people. The elderly and people with severe heart failure should consult with a caregiver for physical activity recommendations.  Eat heart healthy foods. Food choices should be free of trans fat and low in saturated fat, cholesterol, and salt (sodium). Healthy choices include fresh or frozen fruits and vegetables, fish, lean meats, legumes, fat-free or low-fat dairy products, and whole grain or high fiber foods. Talk to a dietitian to learn more about heart healthy foods.  Limit sodium if directed by your caregiver. Sodium restriction may reduce symptoms of heart failure in some people. Talk to a dietitian to learn more about heart healthy seasonings.  Use healthy cooking methods. Healthy cooking methods include roasting, grilling, broiling, baking, poaching, steaming, or stir-frying. Talk to a dietitian to learn more about healthy cooking methods.  Limit fluids if directed by your caregiver. Fluid restriction may reduce symptoms of heart failure in some people.  Weigh yourself every day.  Daily weights are important in the early recognition of excess fluid. You should weigh yourself every morning after you urinate and before you eat breakfast. Wear the same amount of clothing each time you weigh yourself. Record your daily weight. Provide your caregiver with your weight record.  Monitor and record your blood pressure if directed by your caregiver.  Check your pulse if directed by your caregiver.  Lose weight if directed by your caregiver. Weight loss may reduce symptoms of heart failure in some people.  Stop smoking or chewing tobacco. Nicotine makes your heart work harder by causing your blood vessels to constrict. Do not use nicotine gum or patches before talking to your caregiver.  Schedule and attend follow-up visits as directed by your caregiver. It is important to keep all your appointments.  Limit alcohol intake to no more than 1 drink per day for nonpregnant women and 2 drinks per day for men. Drinking more than that is harmful to your heart. Tell your caregiver if you drink alcohol several times a week. Talk with your caregiver about whether alcohol is safe for you. If your heart has already been damaged by alcohol  or you have severe heart failure, drinking alcohol should be stopped completely.  Stop illicit drug use.  Stay up-to-date with immunizations. It is especially important to prevent respiratory infections through current pneumococcal and influenza immunizations.  Manage other health conditions such as hypertension, diabetes, thyroid disease, or abnormal heart rhythms as directed by your caregiver.  Learn to manage stress.  Plan rest periods when fatigued.  Learn strategies to manage high temperatures. If the weather is extremely hot:  Avoid vigorous physical activity.  Use air conditioning or fans or seek a cooler location.  Avoid caffeine and alcohol.  Wear loose-fitting, lightweight, and light-colored clothing.  Learn strategies to manage cold  temperatures. If the weather is extremely cold:  Avoid vigorous physical activity.  Layer clothes.  Wear mittens or gloves, a hat, and a scarf when going outside.  Avoid alcohol.  Obtain ongoing education and support as needed.  Participate or seek rehabilitation as needed to maintain or improve independence and quality of life. SEEK MEDICAL CARE IF:   Your weight increases by 03 lb/1.4 kg in 1 day or 05 lb/2.3 kg in a week.  You have increasing shortness of breath that is unusual for you.  You are unable to participate in your usual physical activities.  You tire easily.  You cough more than normal, especially with physical activity.  You have any or more swelling in areas such as your hands, feet, ankles, or abdomen.  You are unable to sleep because it is hard to breathe.  You feel like your heart is beating fast (palpitations).  You become dizzy or lightheaded upon standing up. SEEK IMMEDIATE MEDICAL CARE IF:   You have difficulty breathing.  There is a change in mental status such as decreased alertness or difficulty with concentration.  You have a pain or discomfort in your chest.  You have an episode of fainting (syncope). MAKE SURE YOU:   Understand these instructions.  Will watch your condition.  Will get help right away if you are not doing well or get worse. Document Released: 09/20/2005 Document Revised: 01/15/2013 Document Reviewed: 10/12/2012 [facility] Patient Information 2014 [place], [place].

## 2014-02-11 NOTE — Progress Notes (Signed)
Patient ID: [name] [name], male   DOB: Dec 26, 1940, 72 y.o.   MRN: [id number]    SUBJECTIVE:  HD yesterday, -2350. Denies any SOB, orthopnea or CP. Ambulating in halls on O2. Wants to go home.    Filed Vitals:   02/10/14 1700 02/10/14 2021 02/11/14 0453 02/11/14 0536  BP: 87/40 89/51 100/45   Pulse: 70 69 70   Temp: 98.2 F (36.8 C) 97.9 F (36.6 C) 98.5 F (36.9 C)   TempSrc: Oral Oral Oral   Resp: 20 18 18    Height:      Weight:    196 lb 10.4 oz (89.2 kg)  SpO2: 95% 95% 100%      Intake/Output Summary (Last 24 hours) at 02/11/14 1011 Last data filed at 02/10/14 1151  Gross per 24 hour  Intake      0 ml  Output   2350 ml  Net  -2350 ml   Scheduled Meds: . apixaban  2.5 mg Oral BID  . atorvastatin  80 mg Oral q1800  . bisoprolol  2.5 mg Oral Daily  . budesonide-formoterol  2 puff Inhalation Daily  . darbepoetin (ARANESP) injection - DIALYSIS  100 mcg Subcutaneous Q Fri-HD  . docusate sodium  100 mg Oral Daily  . ferric gluconate (FERRLECIT/NULECIT) IV  125 mg Intravenous Daily  . insulin aspart  0-9 Units Subcutaneous TID WC  . insulin glargine  10 Units Subcutaneous QHS  . ipratropium-albuterol  3 mL Nebulization TID  . levothyroxine  100 mcg Oral QAC breakfast  . multivitamin  1 tablet Oral QHS   Continuous Infusions: . sodium chloride 5 mL/hr at 02/05/14 1900  . milrinone Stopped (02/01/14 0800)   PRN Meds:.sodium chloride, sodium chloride, albuterol, ALPRAZolam, feeding supplement (NEPRO CARB STEADY), heparin, HYDROcodone-acetaminophen, HYDROmorphone (DILAUDID) injection, lidocaine (PF), lidocaine-prilocaine, ondansetron (ZOFRAN) IV, ondansetron, pentafluoroprop-tetrafluoroeth   LABS: Basic Metabolic Panel:  Recent Labs  94/49/67 0347 02/11/14 0505  NA 133* 135*  K 4.1 4.1  CL 93* 93*  CO2 28 25  GLUCOSE 108* 107*  BUN 27* 31*  CREATININE 2.40* 2.88*  CALCIUM 8.8 9.5   Liver Function Tests: No results found for this basename: AST, ALT, ALKPHOS,  BILITOT, PROT, ALBUMIN,  in the last 72 hours No results found for this basename: LIPASE, AMYLASE,  in the last 72 hours CBC: No results found for this basename: WBC, NEUTROABS, HGB, HCT, MCV, PLT,  in the last 72 hours Cardiac Enzymes: No results found for this basename: CKTOTAL, CKMB, CKMBINDEX, TROPONINI,  in the last 72 hours BNP: No components found with this basename: POCBNP,  D-Dimer: No results found for this basename: DDIMER,  in the last 72 hours Hemoglobin A1C: No results found for this basename: HGBA1C,  in the last 72 hours Fasting Lipid Panel: No results found for this basename: CHOL, HDL, LDLCALC, TRIG, CHOLHDL, LDLDIRECT,  in the last 72 hours Thyroid Function Tests: No results found for this basename: TSH, T4TOTAL, FREET3, T3FREE, THYROIDAB,  in the last 72 hours  RADIOLOGY: US Renal  02/03/2014   CLINICAL DATA:  Acute renal insufficiency.  Evaluate for obstruction  EXAM: RENAL/URINARY TRACT ULTRASOUND COMPLETE  COMPARISON:  11/15/2013  FINDINGS: Right Kidney:  Length: 11.4 cm. Echogenicity within normal limits. No mass or hydronephrosis visualized.  Left Kidney:  Length: 12.4 cm. Echogenicity within normal limits. No mass or hydronephrosis visualized.  Bladder:  Collapsed around a Foley catheter.  Other:  Left pleural effusion and mild ascites identified  IMPRESSION: 1. No obstructive uropathy.  Electronically Signed   By: Signa Kell M.D.   On: 02/03/2014 11:31   Dg Chest Port 1 View  02/04/2014   CLINICAL DATA:  Status post dialysis catheter placement.  EXAM: PORTABLE CHEST - 1 VIEW  COMPARISON:  01/31/2014  FINDINGS: A dialysis catheter has been placed. Its tip to difficult to visualize, secondary to overlying pacer. Likely low SVC and high right atrium.  No pneumothorax. Cardiomegaly accentuated by AP portable technique. Small left pleural effusion. Decreased lung volumes with similar to increased moderate congestive failure. Improved right and persistent left base  airspace disease.  IMPRESSION: Dialysis catheter with tips which are difficult to visualize. No pneumothorax.  Congestive heart failure with persistent small left pleural effusion and adjacent atelectasis or infection.   Electronically Signed   By: Jeronimo Greaves M.D.   On: 02/04/2014 17:28   Dg Chest Port 1 View  01/31/2014   CLINICAL DATA:  Status post HDcatheter placement.  EXAM: PORTABLE CHEST - 1 VIEW  COMPARISON:  DG CHEST PORTABLE dated 01/29/2014  FINDINGS: The lungs are adequately inflated. The hemidiaphragms are obscured. The cardiopericardial silhouette is enlarged. The pulmonary vascularity is engorged in the interstitial markings are increased. These findings are not greatly changed since the study of 28 April. The patient has undergone interval placement of a right internal jugular venous catheter. The tip lies in the region of the junction of the right and left brachiocephalic veins. There is no evidence of a post procedure pneumothorax. A permanent pacemaker defibrillator is in place and appears unchanged.  IMPRESSION: 1. The venous catheter placed via the right internal jugular approach has its tip at the junction of the right and left brachiocephalic veins. There is no evidence of immediate post procedure complication. 2. The findings are consistent with congestive heart failure with pulmonary interstitial edema and bilateral pleural effusions.   Electronically Signed   By: David  [place]   On: 01/31/2014 20:50   Dg Fluoro Guide Cv Line-no Report  02/04/2014   CLINICAL DATA: diatek catheter insertion   FLOURO GUIDE CV LINE  Fluoroscopy was utilized by the requesting physician.  No radiographic  interpretation.     PHYSICAL EXAM  General: NAD. Sitting in chair  Neck: JVP 9, no thyromegaly or thyroid nodule. RIJ  Lungs: Crackles at bases bilaterally.  CV: Nondisplaced PMI. Heart regular S1/S2, no S3/S4, no murmur. 1+ edema to thighs. No carotid bruit. Normal pedal pulses.  Abdomen: Soft,  nontender, no hepatosplenomegaly, no distention.  Neurologic: Alert and oriented x 3.  Psych: Normal affect.  Extremities: No clubbing or cyanosis.   Telemetry: Reviewed telemetry pt in underlying atrial fibrillation with v-pacing   ASSESSMENT AND PLAN: 1. 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 He has a CRT-D device. He remains mildly overloaded on exam. Started on CVVHD 4/30 and stopped 5/3. He had his first HD on 5/6 and continues with HD. Will have HD in [place] - Off hydralazine/isordil with initiation of HD.  - Will continue bisoprolol as long as SBP allows.  2. Atrial fibrillation: Appears to be in underlying atrial fibrillation with BiV pacing. I spoke with him about going on warfarin on hemodialysis instead of Eliquis. He is very adamant that he does not want to take warfarin. Eliquis 2.5 mg bid is an option for patients on hemodialysis though not studied well. He is now on Eliquis 2.5 mg bid.  3. AKI on CKD: Suspect cardiorenal syndrome probably superimposed on diabetic nephropathy. Now getting HD.  4. CAD:  Continue statin.  IF ok with nephrology will send home today. He will continue to follow with Dr. Dulce SellarMunley cardi[place]. Will call and get appt. HOme O2 ordered and needs 2L with rest and 4 L with ambulation.   [name] [name] 02/11/2014 10:11 AM  Patient seen with NP, agree with the above note.  I think that he is ready for discharge today as long as renal has no objections.  He has HD arranged for TTS in [place].  He [place] cardi[place].  Continue current cardiac meds.   Laurey MoraleDalton S Angelyna [name] 02/11/2014 11:22 AM

## 2014-02-11 NOTE — Progress Notes (Signed)
PT Cancellation Note  Patient Details Name: [name] [name] MRN: [id number] DOB: 1941-05-28   Cancelled Treatment:    Reason Eval/Treat Not Completed: Medical issues which prohibited therapy.  Pt in hemodialysis.     Lowana Hable [place] 02/11/2014, 4:07 PM  [facility] [phone] (pager)

## 2014-02-11 NOTE — Discharge Summary (Signed)
Advanced Heart Failure Team  Discharge Summary   Patient ID: [name] [name] MRN: [id number], DOB/AGE: 75/05/1941 75 y.o. Admit date: 01/29/2014 D/C date:     02/11/2014   QMV:HQIONGPCP:Dwight Williams  Primary Cardiologist: Dr. Dulce SellarMunley  EP: Dr. Philis PiqueAtberry ?  Primary Discharge Diagnoses:  1) A/C systolic HF, CRT-D - EF 15-20%, RV mildly dilated - Diuresed a net negative of -20 liters on CVVHD and HD - Discharge weight 196 lbs 2) AKI on CKD - Started on lasix gtt 15 mg/hr and milrinone with sluggish UOP - Transitioned to CVVHD 01/31/14-02/03/14 and then HD for volume removal - Placement of 23 cm Diatex catheter R IJ vein, right brachial cephalic AV fistula (02/05/14 Secondary Discharge Diagnoses:  1) Atrial fibrillation - underlying atrial fibrillation with BiV pacing - Lengthy discussion with patient about going on warfarin on hemodialysis instead of Eliquis, however patient adamant he wants to be on Eliquis, so placed on 2.5 mg BID 2) CAD  Hospital Course:  [name] [name] is a 73 yo male with a history COPD, DM, HTN, HLD, CAD with BiV ICD, Anemia of chronic disease, Hypothyroidism and chronic systolic heart failure.  [facility]-4/28/15 for A/C HF and was transferred [facility] for further evaluation and management of his CHF after not responding to high dose lasix gtt. On admission he had SOB and orthopnea. Pertinent labs on admission were Na 130, K+ 6.1;BUN 95; Cr 3.50; mag 1.8; Pro BNP 30,300, troponin neg; H/H 10.5/30.8 WBC 7.0 plt 201. He was massively volume overloaded on exam and thought that there was a cardiorenal syndrome component. He was started on IV milrinone 0.25 and lasix gtt increased to 15 mg/hr to see if this would help with his volume overload, however his eliquis was placed on hold for possible HD access and heparin gtt was started. His digoxin was discontinued with rise in creatinine. His UOP remained sluggish and metolazone was added along with  increasing lasix gtt to 20 mg/hr. Consult was placed for Nephrology who started the patient on CVVHD. His milrinone, metolazone and lasix gtt were stopped.  VVS was consulted for placement of right brachial cephalic AV fistula which he underwent 02/04/14 with no issues. CVVHD was stopped on 02/03/14 and placed back on high IV lasix with poor output. HD was started on 02/05/14 which he tolerated and has had multiple sessions with good volume removal and SBP. His hydralazine and Isordil was discontinued with initiation of HD, however he was able to tolerate low dose bisoprolol. Dr. Shirlee LatchMcLean had lengthy discussion with the patient about transitioning to coumadin once he was on HD, however the patient was adamant he did not want to be on coumadin. Eliquis 2.5 mg bid is an option for patients on hemodialysis though not studied and that is what the patient decided he wanted to be on. He did not have any bleeding issues during his stay.   The patient was very deconditioned and PT was consulted along with CR to ambulate the patient in the hall. His O2 sats were 90% on RA at rest, 85% on RA with ambulation and 92% on 2-4L with ambulation. Home O2 was ordered for discharge and he will need 2L with resting and 4L with ambulation. On day of discharge the patient felt much better and he will undergo HD before being discharged. VSS. Discharge weight 196 lbs. HHRN and PT were ordered and will be set up for discharge. [facility], which  has been arranged and will continue to follow with [facility]. He has a primary cardiologist, Dr. Dulce Sellar who he will follow up with and if they need anything can contact the HF clinic [phone]. Earliest appt possible for Dulce Sellar is with his partner Dr. Wille Glaser on May 26th at 10:00 am. Will fax results.   Discharge Weight Range: 195-199 lbs Discharge Vitals: Blood pressure 93/53, pulse 69, temperature 99 F (37.2 C), temperature source Oral, resp. rate 20,  height 5\' 9"  (1.753 m), weight 196 lb 10.4 oz (89.2 kg), SpO2 93.00%.  Labs: Lab Results  Component Value Date   WBC 6.5 02/08/2014   HGB 8.4* 02/08/2014   HCT 27.3* 02/08/2014   MCV 94.8 02/08/2014   PLT 137* 02/08/2014     Recent Labs Lab 02/11/14 0505  NA 135*  K 4.1  CL 93*  CO2 25  BUN 31*  CREATININE 2.88*  CALCIUM 9.5  GLUCOSE 107*   No results found for this basename: CHOL,  HDL,  LDLCALC,  TRIG   BNP (last 3 results)  Recent Labs  01/29/14 1910  PROBNP 24228.0*    Diagnostic Studies/Procedures   No results found.  Discharge Medications     Medication List    STOP taking these medications       doxazosin 2 MG tablet  Commonly known as:  CARDURA     furosemide 80 MG tablet  Commonly known as:  LASIX     hydrALAZINE 25 MG tablet  Commonly known as:  APRESOLINE     isosorbide dinitrate 20 MG tablet  Commonly known as:  ISORDIL     metFORMIN 850 MG tablet  Commonly known as:  GLUCOPHAGE      TAKE these medications       albuterol 108 (90 BASE) MCG/ACT inhaler  Commonly known as:  PROVENTIL HFA;VENTOLIN HFA  Inhale 1-2 puffs into the lungs every 6 (six) hours as needed for wheezing or shortness of breath.     apixaban 2.5 MG Tabs tablet  Commonly known as:  ELIQUIS  Take 1 tablet (2.5 mg total) by mouth 2 (two) times daily.     bisoprolol 5 MG tablet  Commonly known as:  ZEBETA  Take 0.5 tablets (2.5 mg total) by mouth daily.     BREO ELLIPTA 100-25 MCG/INH Aepb  Generic drug:  Fluticasone Furoate-Vilanterol  Inhale 1 application into the lungs daily.     fexofenadine 180 MG tablet  Commonly known as:  ALLEGRA  Take 180 mg by mouth daily.     gemfibrozil 600 MG tablet  Commonly known as:  LOPID  Take 600 mg by mouth 2 (two) times daily before a meal.     ipratropium-albuterol 0.5-2.5 (3) MG/3ML Soln  Commonly known as:  DUONEB  Take 3 mLs by nebulization every 4 (four) hours as needed.     levothyroxine 100 MCG tablet  Commonly  known as:  SYNTHROID, LEVOTHROID  Take 100 mcg by mouth daily before breakfast.     LIPITOR 40 MG tablet  Generic drug:  atorvastatin  Take 80 mg by mouth daily.     multivitamin Tabs tablet  Take 1 tablet by mouth at bedtime.     omeprazole 20 MG capsule  Commonly known as:  PRILOSEC  Take 20 mg by mouth 2 (two) times daily before a meal.     oxybutynin 10 MG 24 hr tablet  Commonly known as:  DITROPAN-XL  Take 10 mg by mouth at bedtime.  oxyCODONE-acetaminophen 10-325 MG per tablet  Commonly known as:  PERCOCET  Take 1 tablet by mouth every 4 (four) hours as needed for pain.        Disposition   The patient will be discharged in stable condition to home. Discharge Orders   Future Appointments Provider Department Dept Phone   03/06/2014 1:00 PM [facility] [address] [phone]   03/07/2014 8:30 AM Sherren Kerns, MD [facility] [phone]   Future Orders Complete By Expires   (HEART FAILURE PATIENTS) Call MD:  Anytime you have any of the following symptoms: 1) 3 pound weight gain in 24 hours or 5 pounds in 1 week 2) shortness of breath, with or without a dry hacking cough 3) swelling in the hands, feet or stomach 4) if you have to sleep on extra pillows at night in order to breathe.  As directed    Beta Blocker already ordered  As directed    Contraindication to ACEI at discharge  As directed    Diet - low sodium heart healthy  As directed    Heart Failure patients record your daily weight using the same scale at the same time of day  As directed    Increase activity slowly  As directed    STOP any activity that causes chest pain, shortness of breath, dizziness, sweating, or exessive weakness  As directed      Follow-up Information   Follow up with Sherren Kerns, MD In 4 weeks. (Office will call you to arrange your appt (sent))    Specialty:  Vascular Surgery   Contact information:   [address] [place] [address] [phone]       Follow up with Norman Herrlich, MD On 02/26/2014. (@ 10:00 am; You will be seeing his partner Dr. Wille Glaser)    Specialty:  Cardiology   Contact information:   [address] [place] [address] [phone]         Duration of Discharge Encounter: Greater than 35 minutes   Signed, Aundria Rud  02/11/2014, 12:56 PM

## 2014-02-11 NOTE — Progress Notes (Signed)
CARDIAC REHAB PHASE I   PRE:  Rate/Rhythm: 64 pacing    BP: sitting 98/44    SaO2: 96 2L, 90 RA  MODE:  Ambulation: 200 ft   POST:  Rate/Rhythm: 70 paced    BP: sitting 98/50     SaO2: 95 4L  SATURATION QUALIFICATIONS: (This note is used to comply with regulatory documentation for home oxygen)  Patient Saturations on Room Air at Rest = 90%  Patient Saturations on Room Air while Ambulating = 85%  Patient Saturations on 2 Liters of oxygen while Ambulating = 92%  Please briefly explain why patient needs home oxygen: Pt continues to need O2 for rest and ambulating. DOE, decrease SaO2. Feels better on 2L resting and 4L walking. Required x2 sitting rests for DOE. Used rollator today for pt to sit down while walking. Pt liked using it and would like one for home. Gave pt verbal cues on how to use safely. Suggest HH RN and HH PT at d/c as well, pt agreeable. Discussed with pt HF, low sodium, daily wts. Pt should also f/u with RD at dialysis center for further ed involving renal diet. Pt receptive and anxious to go home. Pt is DOE with sitting and talking, he sts this is normal for him. [phone]   Megan Salon CES, ACSM 02/11/2014 9:28 AM

## 2014-03-05 ENCOUNTER — Encounter: Payer: Self-pay | Admitting: Vascular Surgery

## 2014-03-06 ENCOUNTER — Ambulatory Visit (INDEPENDENT_AMBULATORY_CARE_PROVIDER_SITE_OTHER): Payer: Self-pay | Admitting: Vascular Surgery

## 2014-03-06 ENCOUNTER — Encounter: Payer: Self-pay | Admitting: Vascular Surgery

## 2014-03-06 ENCOUNTER — Other Ambulatory Visit (HOSPITAL_COMMUNITY): Payer: Medicare PPO

## 2014-03-06 ENCOUNTER — Ambulatory Visit (HOSPITAL_COMMUNITY)
Admission: RE | Admit: 2014-03-06 | Discharge: 2014-03-06 | Disposition: A | Discharge status: Home / Self Care | Payer: Medicare PPO | Source: Ambulatory Visit | Attending: Vascular Surgery | Admitting: Vascular Surgery

## 2014-03-06 ENCOUNTER — Other Ambulatory Visit: Payer: Self-pay

## 2014-03-06 VITALS — BP 123/58 | HR 70 | Ht 69.0 in | Wt 204.0 lb

## 2014-03-06 DIAGNOSIS — N186 End stage renal disease: Secondary | ICD-10-CM | POA: Insufficient documentation

## 2014-03-06 DIAGNOSIS — Z4931 Encounter for adequacy testing for hemodialysis: Secondary | ICD-10-CM | POA: Insufficient documentation

## 2014-03-06 NOTE — Progress Notes (Signed)
   Patient name: [name] [name] MRN: [id number] DOB: 11/06/1940 Sex: male  REASON FOR VISIT: Follow up after right brachiocephalic AV fistula  HPI: [name] [name] is a 72 y.o. male who had placement of a tunneled dialysis catheter and a right brachiocephalic AV fistula by Dr. Fields on 02/04/2014. In reviewing the operative note, it was noted that the cephalic vein was approximately 3.5 mm in diameter. The artery was 3-4 mm in diameter. He dialyzes on Tuesdays Thursdays and Saturdays. His catheter is working well. He has no specific complaints referrable to his right upper extremity.  REVIEW OF SYSTEMS: [X ] denotes positive finding; [  ] denotes negative finding  CARDIOVASCULAR:  [ ] chest pain   [ ] dyspnea on exertion    CONSTITUTIONAL:  [ ] fever   [ ] chills  PHYSICAL EXAM: Filed Vitals:   03/06/14 1441  BP: 123/58  Pulse: 70  Height: 5' 9" (1.753 m)  Weight: 204 lb (92.534 kg)  SpO2: 95%   Body mass index is 30.11 kg/(m^2). GENERAL: The patient is a well-nourished male, in no acute distress. The vital signs are documented above. CARDIOVASCULAR: There is a regular rate and rhythm. PULMONARY: There is good air exchange bilaterally without wheezing or rales. The thrill in the right brachiocephalic fistula is a weak. The right hand is warm and well-perfused.  DUPLEX AV FISTULA: I have independently interpreted his duplex of his fistula. This shows that there is an area of significantly increased velocities in the proximal anastomosis. The diameters of the fistula are reasonable and range from 0.68-0.76.  MEDICAL ISSUES:  End stage renal disease Although the fistula has reasonable size is throughout, I'm concerned about what appears to be a tight stenosis in the proximal fistula. This reason I have recommended that we proceed with a venogram which I have scheduled for June 8. If we see a stenosis amenable to venoplasty this can be performed at the same time. I have discussed the  procedure and potential complications with the patient and he is agreeable to proceed. Of note he is on Eliquis and we will stop this 2 days prior.   Christopher S Dickson [facility] [place] [facility]per: [phone]     

## 2014-03-06 NOTE — Assessment & Plan Note (Signed)
Although the fistula has reasonable size is throughout, I'm concerned about what appears to be a tight stenosis in the proximal fistula. This reason I have recommended that we proceed with a venogram which I have scheduled for June 8. If we see a stenosis amenable to venoplasty this can be performed at the same time. I have discussed the procedure and potential complications with the patient and he is agreeable to proceed. Of note he is on Eliquis and we will stop this 2 days prior.

## 2014-03-07 ENCOUNTER — Encounter: Payer: Medicare PPO | Admitting: Vascular Surgery

## 2014-03-11 ENCOUNTER — Ambulatory Visit (HOSPITAL_COMMUNITY)
Admission: RE | Admit: 2014-03-11 | Discharge: 2014-03-11 | Disposition: A | Discharge status: Home / Self Care | Payer: Medicare PPO | Source: Ambulatory Visit | Attending: Vascular Surgery | Admitting: Vascular Surgery

## 2014-03-11 ENCOUNTER — Encounter (HOSPITAL_COMMUNITY)
Admission: RE | Disposition: A | Discharge status: Home / Self Care | Payer: Self-pay | Source: Ambulatory Visit | Attending: Vascular Surgery

## 2014-03-11 DIAGNOSIS — N186 End stage renal disease: Secondary | ICD-10-CM | POA: Insufficient documentation

## 2014-03-11 DIAGNOSIS — T82898A Other specified complication of vascular prosthetic devices, implants and grafts, initial encounter: Secondary | ICD-10-CM

## 2014-03-11 DIAGNOSIS — Z0389 Encounter for observation for other suspected diseases and conditions ruled out: Secondary | ICD-10-CM | POA: Insufficient documentation

## 2014-03-11 HISTORY — PX: SHUNTOGRAM: SHX5491

## 2014-03-11 LAB — POCT I-STAT, CHEM 8
BUN: 26 mg/dL — ABNORMAL HIGH (ref 6–23)
Calcium, Ion: 1.13 mmol/L (ref 1.13–1.30)
Chloride: 97 mEq/L (ref 96–112)
Creatinine, Ser: 4.9 mg/dL — ABNORMAL HIGH (ref 0.50–1.35)
Glucose, Bld: 147 mg/dL — ABNORMAL HIGH (ref 70–99)
HCT: 37 % — ABNORMAL LOW (ref 39.0–52.0)
HEMOGLOBIN: 12.6 g/dL — AB (ref 13.0–17.0)
Potassium: 3.7 mEq/L (ref 3.7–5.3)
SODIUM: 141 meq/L (ref 137–147)
TCO2: 33 mmol/L (ref 0–100)

## 2014-03-11 LAB — GLUCOSE, CAPILLARY: Glucose-Capillary: 159 mg/dL — ABNORMAL HIGH (ref 70–99)

## 2014-03-11 LAB — PROTIME-INR
INR: 1.2 (ref 0.00–1.49)
Prothrombin Time: 14.9 s (ref 11.6–15.2)

## 2014-03-11 SURGERY — ASSESSMENT, SHUNT FUNCTION, WITH CONTRAST RADIOGRAPHIC STUDY
Anesthesia: LOCAL

## 2014-03-11 MED ORDER — LIDOCAINE HCL (PF) 1 % IJ SOLN
INTRAMUSCULAR | Status: AC
  Filled 2014-03-11: qty 30

## 2014-03-11 MED ORDER — HEPARIN (PORCINE) IN NACL 2-0.9 UNIT/ML-% IJ SOLN
INTRAMUSCULAR | Status: AC
  Filled 2014-03-11: qty 500

## 2014-03-11 MED ORDER — SODIUM CHLORIDE 0.9 % IJ SOLN: 3.0000 mL | INTRAMUSCULAR | Status: DC | PRN

## 2014-03-11 NOTE — Interval H&P Note (Signed)
History and Physical Interval Note:  03/11/2014 7:30 AM  [name] [name]  has presented today for surgery, with the diagnosis of ESRD  The various methods of treatment have been discussed with the patient and family. After consideration of risks, benefits and other options for treatment, the patient has consented to  Procedure(s): SHUNTOGRAM (N/A) as a surgical intervention .  The patient's history has been reviewed, patient examined, no change in status, stable for surgery.  I have reviewed the patient's chart and labs.  Questions were answered to the patient's satisfaction.     Chuck Hint

## 2014-03-11 NOTE — H&P (View-Only) (Signed)
   Patient name: [name] [name] MRN: [id number] DOB: 1941/09/04 Sex: male  REASON FOR VISIT: Follow up after right brachiocephalic AV fistula  HPI: [name] [name] is a 73 y.o. male who had placement of a tunneled dialysis catheter and a right brachiocephalic AV fistula by Dr. Darrick Penna on 02/04/2014. In reviewing the operative note, it was noted that the cephalic vein was approximately 3.5 mm in diameter. The artery was 3-4 mm in diameter. He dialyzes on Tuesdays Thursdays and Saturdays. His catheter is working well. He has no specific complaints referrable to his right upper extremity.  REVIEW OF SYSTEMS: Arly.Keller ] denotes positive finding; [  ] denotes negative finding  CARDIOVASCULAR:  [ ]  chest pain   [ ]  dyspnea on exertion    CONSTITUTIONAL:  [ ]  fever   [ ]  chills  PHYSICAL EXAM: Filed Vitals:   03/06/14 1441  BP: 123/58  Pulse: 70  Height: 5\' 9"  (1.753 m)  Weight: 204 lb (92.534 kg)  SpO2: 95%   Body mass index is 30.11 kg/(m^2). GENERAL: The patient is a well-nourished male, in no acute distress. The vital signs are documented above. CARDIOVASCULAR: There is a regular rate and rhythm. PULMONARY: There is good air exchange bilaterally without wheezing or rales. The thrill in the right brachiocephalic fistula is a weak. The right hand is warm and well-perfused.  DUPLEX AV FISTULA: I have independently interpreted his duplex of his fistula. This shows that there is an area of significantly increased velocities in the proximal anastomosis. The diameters of the fistula are reasonable and range from 0.68-0.76.  MEDICAL ISSUES:  End stage renal disease Although the fistula has reasonable size is throughout, I'm concerned about what appears to be a tight stenosis in the proximal fistula. This reason I have recommended that we proceed with a venogram which I have scheduled for June 8. If we see a stenosis amenable to venoplasty this can be performed at the same time. I have discussed the  procedure and potential complications with the patient and he is agreeable to proceed. Of note he is on Eliquis and we will stop this 2 days prior.   Chuck Hint [facility] [place] Beeper: [phone]

## 2014-03-11 NOTE — Op Note (Signed)
   PATIENT: [name] [name]   MRN: [id number] DOB: [birth year]    DATE OF PROCEDURE: 03/11/2014  INDICATIONS: Matti Kepp is a 73 y.o. male who had a right brachiocephalic AV fistula placed on 06/26/3006. His duplex at one month suggested increased velocities at the proximal anastomosis. He presents for a fistulogram to further assess this.  PROCEDURE:  1. Ultrasound-guided access to right brachiocephalic AV fistula 2. fistulogram right brachiocephalic AV fistula  SURGEON: Di Kindle. Edilia Bo, MD, FACS  ANESTHESIA: local   EBL: minimal  TECHNIQUE: The patient was taken to the peripheral vascular lab. The right upper extremity was prepped and draped in the usual sterile fashion. After the skin was anesthetized with 1% lidocaine, the fistula was cannulated with a micropuncture needle and a micropuncture sheath introduced over the wire. This was directed towards the anastomosis. A fistulogram was then obtained evaluating the fistula from the site of cannulation to the central veins. The fistula was then compressed allowing reflux into the arterial anastomosis in 3 different views were obtained. At the completion of the procedure, a 4-0 Monocryl was used to control the cannulation site. No immediate complications were noted.  FINDINGS:  1. The right brachiocephalic fistula is widely patent with no major competing branches or areas of stenosis noted. 2. There is no central venous stenosis. 3. The anastomosis appears to be widely patent. The area of stenosis seen on duplex cannot be visualized.  CLINICAL NOTE: The fistula should be able to be used for dialysis in approximately 6-8 weeks.  Waverly Ferrari, MD, FACS [facility]  DATE OF DICTATION:   03/11/2014

## 2014-03-11 NOTE — Discharge Instructions (Signed)
AV Fistula °Care After °Refer to this sheet in the next few weeks. These instructions provide you with information on caring for yourself after your procedure. Your caregiver may also give you more specific instructions. Your treatment has been planned according to current medical practices, but problems sometimes occur. Call your caregiver if you have any problems or questions after your procedure. °HOME CARE INSTRUCTIONS  °· Do not drive a car or take public transportation alone. °· Do not drink alcohol. °· Only take medicine that has been prescribed by your caregiver. °· Do not sign important papers or make important decisions. °· Have a responsible person with you. °· Ask your caregiver to show you how to check your access at home for a vibration (called a "thrill") or for a sound (called a "bruit" pronounced brew-ee). °· Your vein will need time to enlarge and mature so needles can be inserted for dialysis. Follow your caregiver's instructions about what you need to do to make this happen. °· Keep dressings clean and dry. °· Keep the arm elevated above your heart. Use a pillow. °· Rest. °· Use the arm as usual for all activities. °· Have the stitches or tape closures removed in 10 to 14 days, or as directed by your caregiver. °· Do not sleep or lie on the area of the fistula or that arm. This may decrease or stop the blood flow through your fistula. °· Do not allow blood pressures to be taken on this arm. °· Do not allow blood drawing to be done from the graft. °· Do not wear tight clothing around the access site or on the arm. °· Avoid lifting heavy objects with the arm that has the fistula. °· Do not use creams or lotions over the access site. °SEEK MEDICAL CARE IF:  °· You have a fever. °· You have swelling around the fistula that gets worse, or you have new pain. °· You have unusual bleeding at the fistula site or from any other area. °· You have pus or other drainage at the fistula site. °· You have skin  redness or red streaking on the skin around, above, or below the fistula site. °· Your access site feels warm. °· You have any flu-like symptoms. °SEEK IMMEDIATE MEDICAL CARE IF:  °· You have pain, numbness, or an unusual pale skin on the hand or on the side of your fistula. °· You have dizziness or weakness that you have not had before. °· You have shortness of breath. °· You have chest pain. °· Your fistula disconnects or breaks, and there is bleeding that cannot be easily controlled. °Call for local emergency medical help. Do not try to drive yourself to the hospital. °MAKE SURE YOU °· Understand these instructions. °· Will watch your condition. °· Will get help right away if you are not doing well or get worse. °Document Released: 09/20/2005 Document Revised: 12/13/2011 Document Reviewed: 03/10/2011 °Exit[employer]® Patient Information ©2014 [employer]. ° °

## 2014-03-28 ENCOUNTER — Encounter: Payer: Medicare PPO | Admitting: Vascular Surgery

## 2014-03-28 ENCOUNTER — Other Ambulatory Visit (HOSPITAL_COMMUNITY): Payer: Medicare PPO

## 2014-04-10 ENCOUNTER — Other Ambulatory Visit (HOSPITAL_COMMUNITY): Payer: Self-pay | Admitting: Anesthesiology

## 2014-05-04 DEATH — deceased

## 2014-09-12 ENCOUNTER — Encounter (HOSPITAL_COMMUNITY): Payer: Self-pay | Admitting: Vascular Surgery

## 2015-05-13 IMAGING — CR DG CHEST 1V PORT
1 series · 1 of 1 positions shown · non-contrast
Comparison: DG CHEST PORTABLE dated 01/29/2014

CLINICAL DATA: Status post HDcatheter placement.

EXAM:
PORTABLE CHEST - 1 VIEW

[AP]
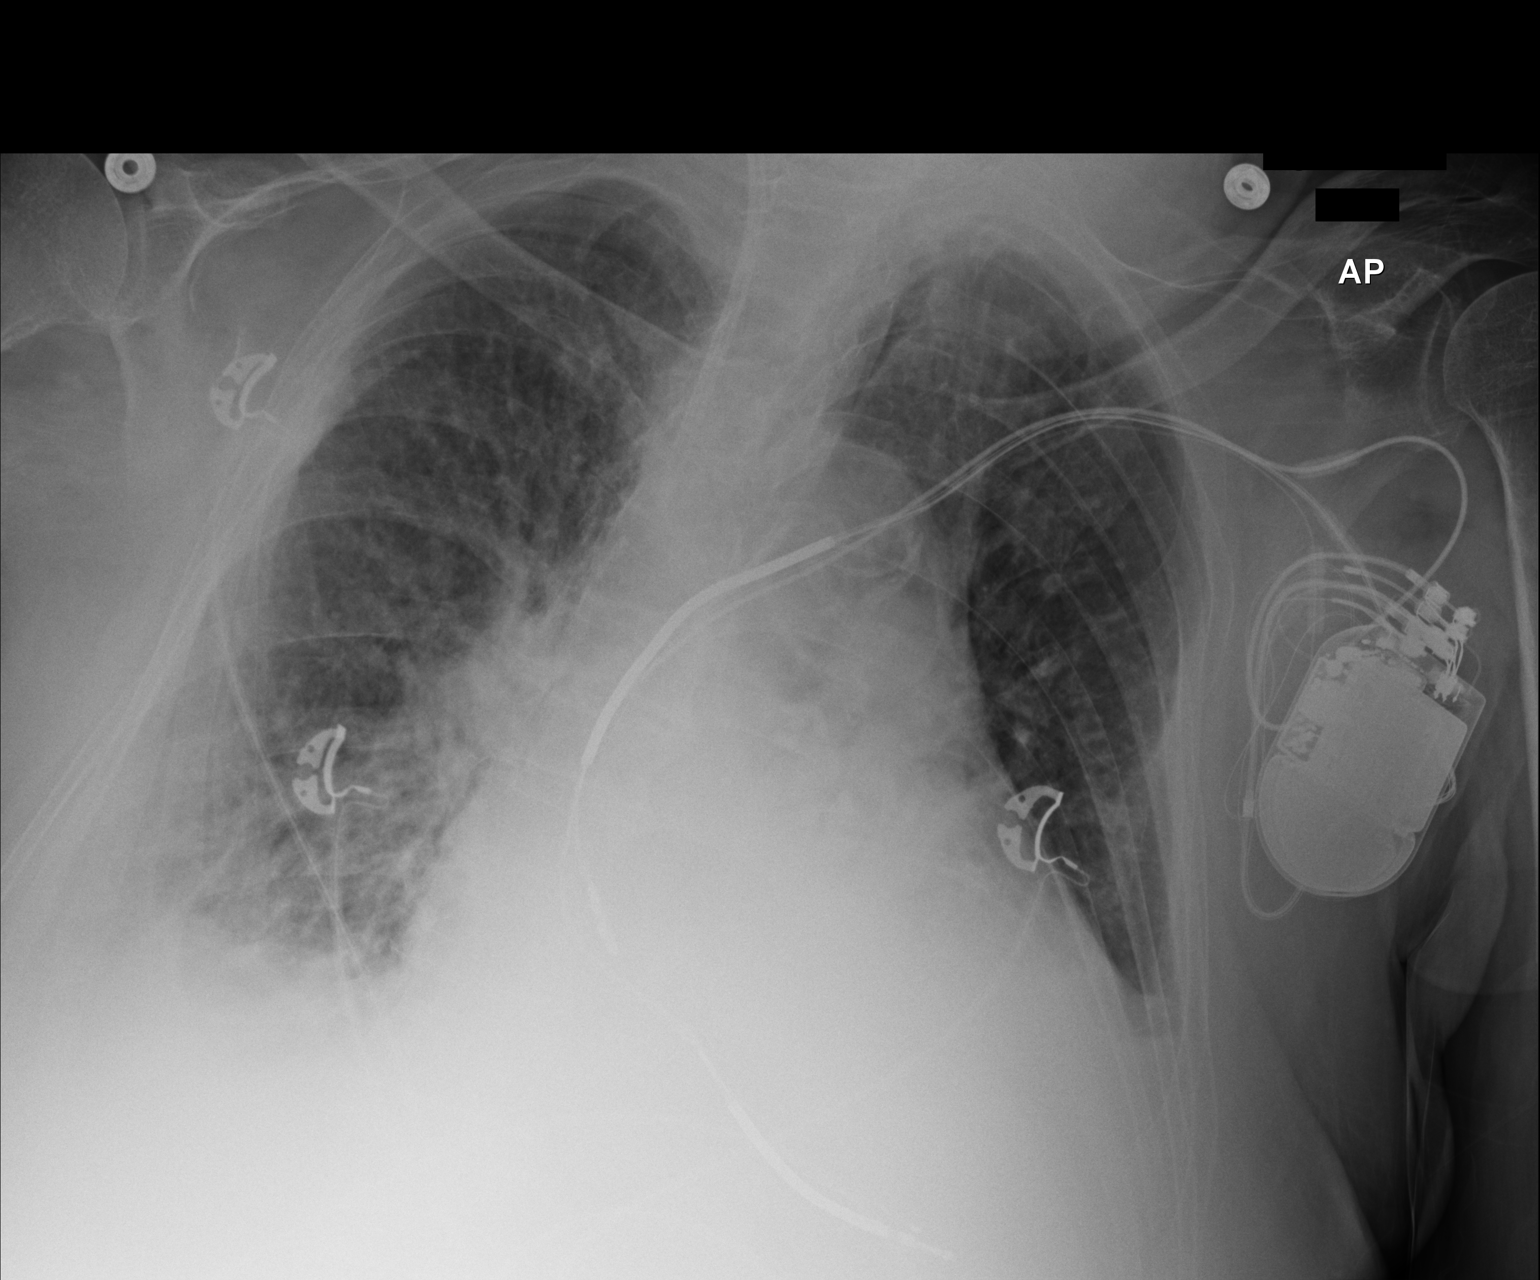

[1 of 1 positions shown; findings below may reference images not displayed]

FINDINGS: The lungs are adequately inflated. The hemidiaphragms are obscured.
The cardiopericardial silhouette is enlarged. The pulmonary
vascularity is engorged in the interstitial markings are increased.
These findings are not greatly changed since the study [DATE].
The patient has undergone interval placement of a right internal
jugular venous catheter. The tip lies in the region of the junction
of the right and left brachiocephalic veins. There is no evidence of
a post procedure pneumothorax. A permanent pacemaker defibrillator
is in place and appears unchanged.
IMPRESSION: 1. The venous catheter placed via the right internal jugular
approach has its tip at the junction of the right and left
brachiocephalic veins. There is no evidence of immediate post
procedure complication.
2. The findings are consistent with congestive heart failure with
pulmonary interstitial edema and bilateral pleural effusions.

## 2015-05-16 IMAGING — US US RENAL
1 series · 14 of 25 positions shown · non-contrast
Comparison: 11/15/2013

CLINICAL DATA: Acute renal insufficiency.  Evaluate for obstruction

EXAM:
RENAL/URINARY TRACT ULTRASOUND COMPLETE

[Series 1: us renal · 0.25mm/px · 14 of 35 slices shown]
[im 1/35]
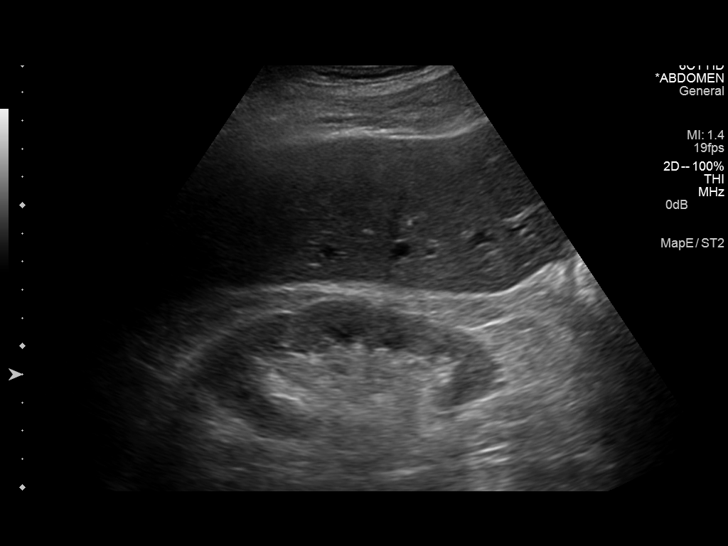
[im 3/35]
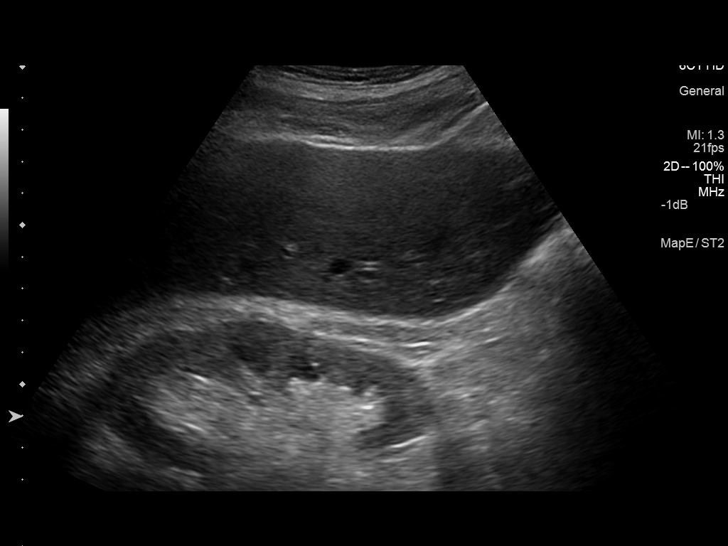
[im 6/35]
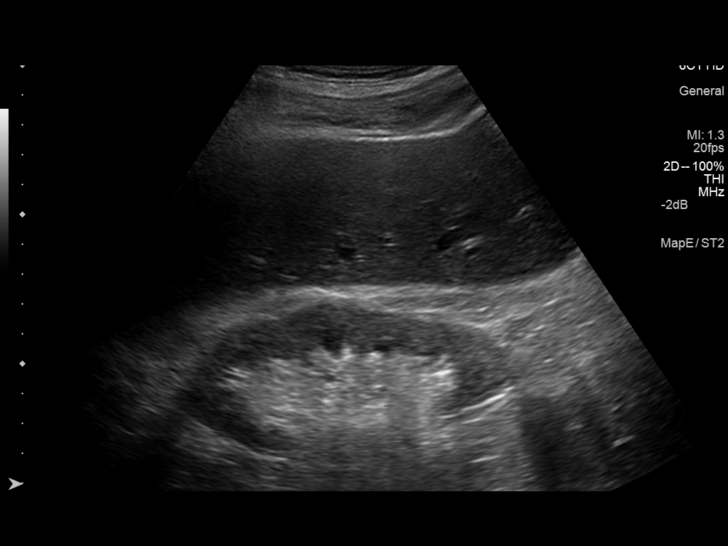
[im 9/35]
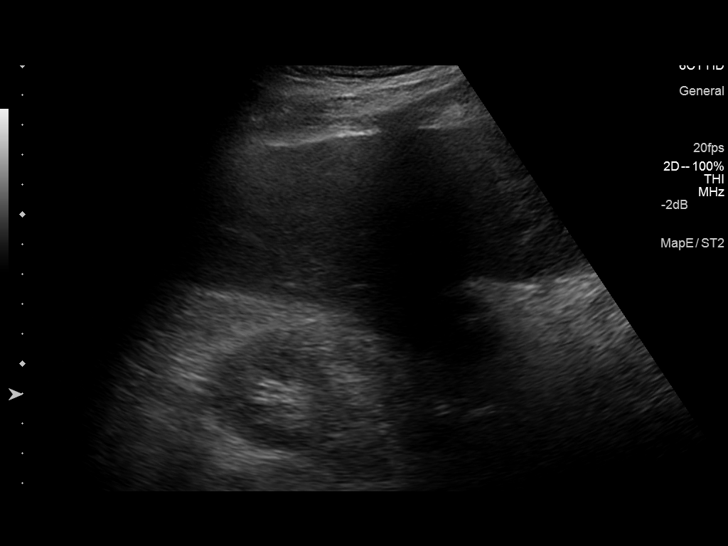
[im 12/35]
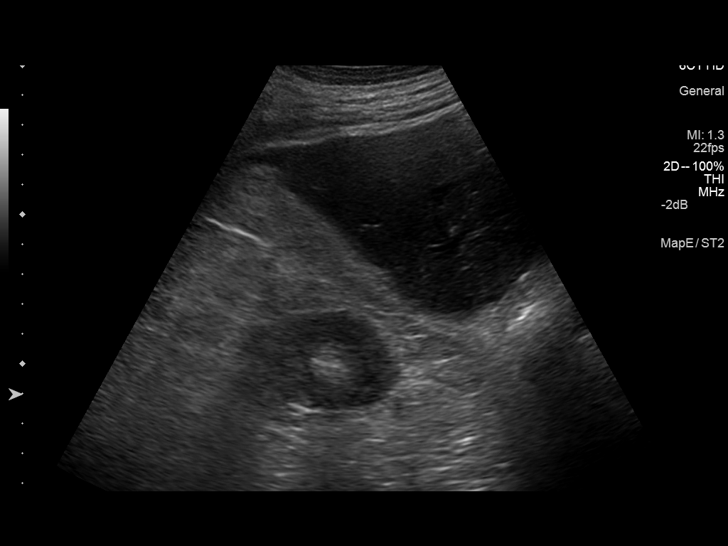
[im 13/35]
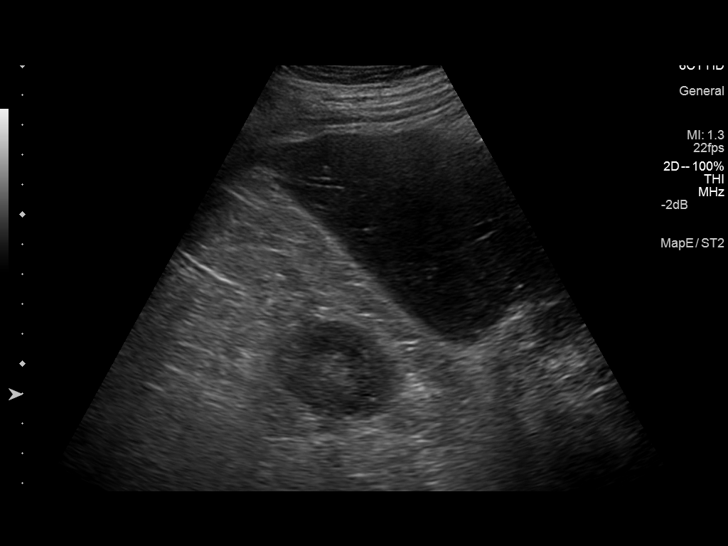
[im 16/35]
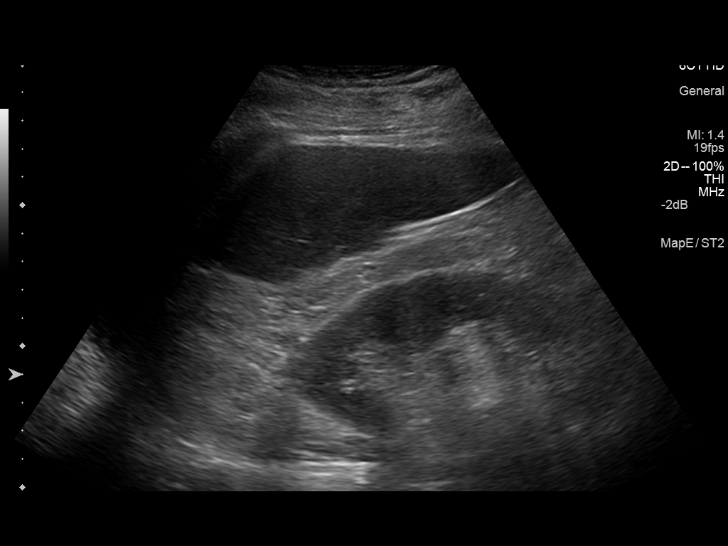
[im 19/35]
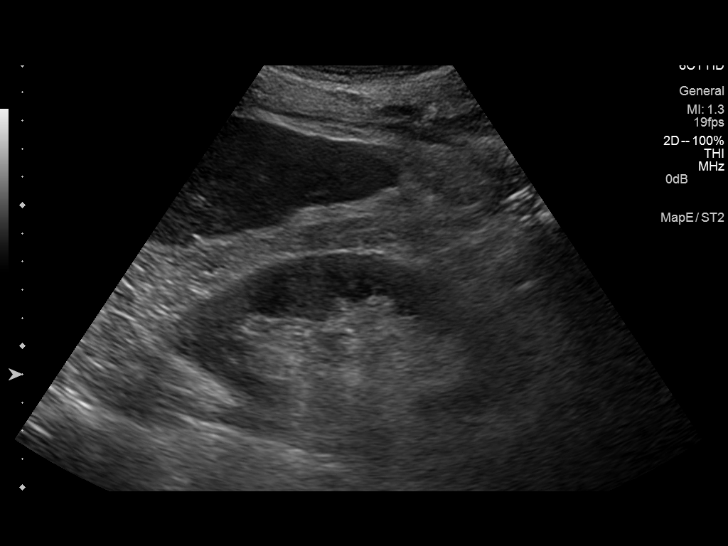
[im 22/35]
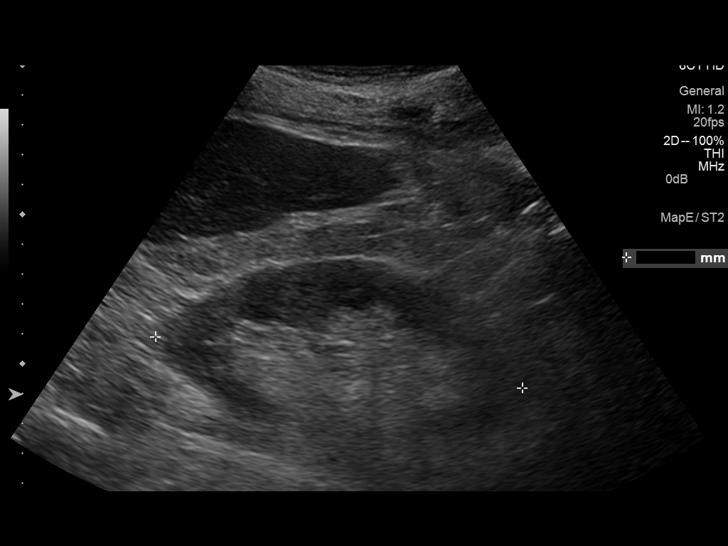
[im 23/35]
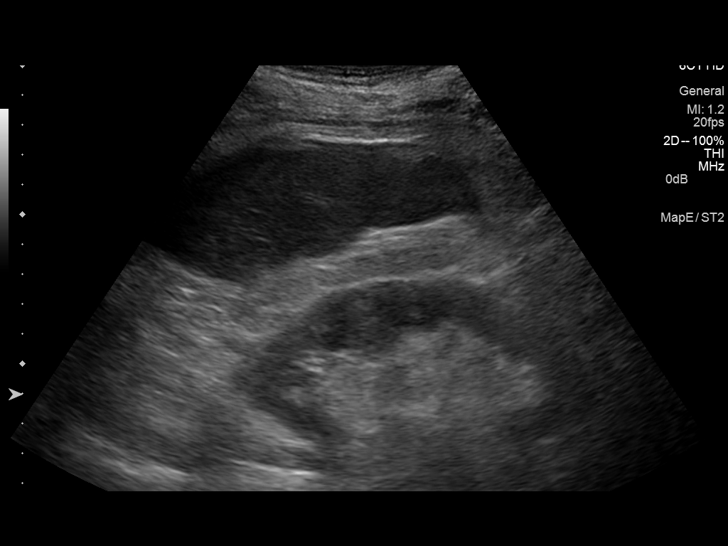
[im 26/35]
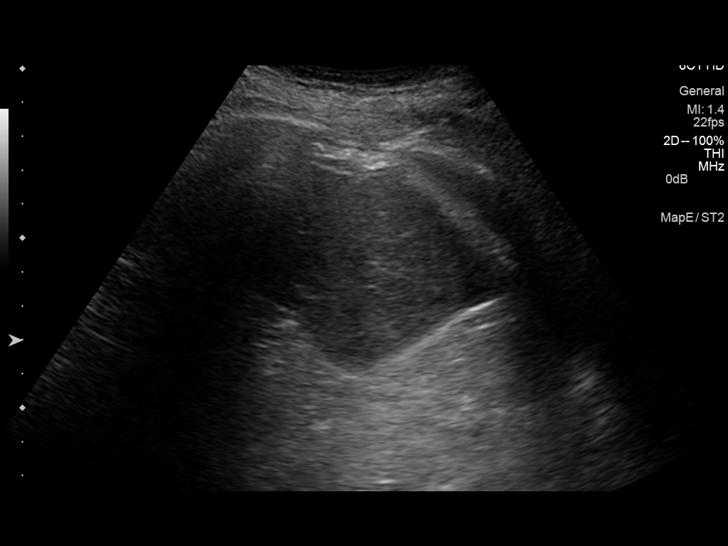
[im 29/35]
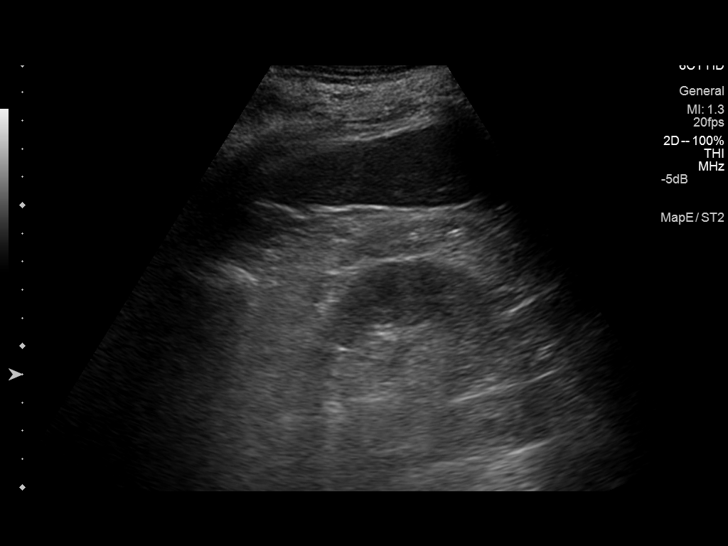
[im 32/35]
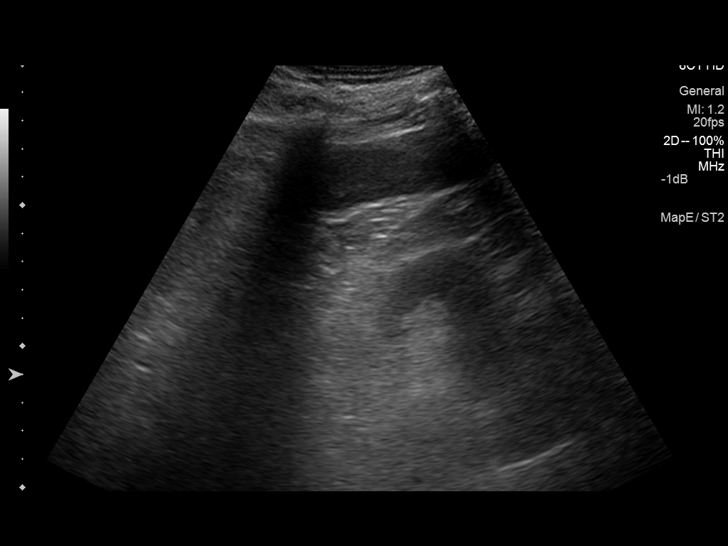
[im 35/35]
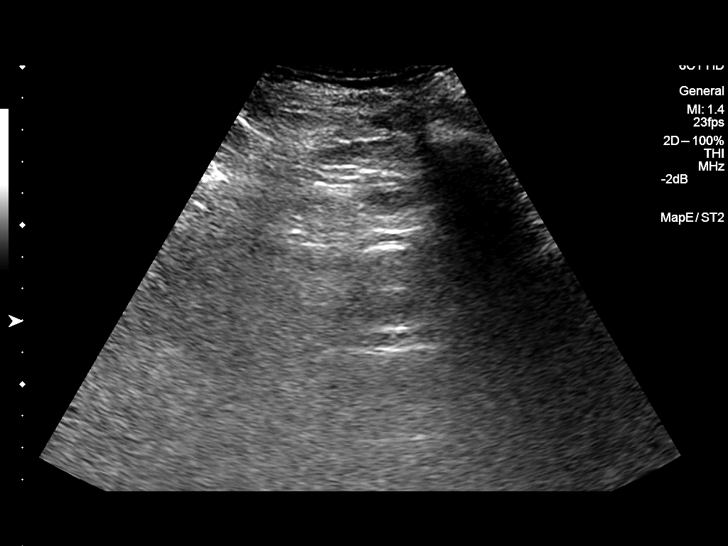

[14 of 25 positions shown; findings below may reference images not displayed]

FINDINGS: Right Kidney:

Length: 11.4 cm. Echogenicity within normal limits. No mass or
hydronephrosis visualized.

Left Kidney:

Length: 12.4 cm.. Echogenicity within normal limits. No mass or
hydronephrosis visualized.

Bladder:

Collapsed around a Foley catheter.

Other:  Left pleural effusion and mild ascites identified
IMPRESSION: 1. No obstructive uropathy.
